# Patient Record
Sex: Male | Born: 1945 | Race: White | Hispanic: No | Marital: Single | Smoking: Never smoker
Health system: Southern US, Community
[De-identification: ages and names within clinical notes are randomized; demographics above are authoritative.]

## PROBLEM LIST (undated history)

## (undated) DIAGNOSIS — H269 Unspecified cataract: Secondary | ICD-10-CM

## (undated) DIAGNOSIS — M109 Gout, unspecified: Secondary | ICD-10-CM

## (undated) DIAGNOSIS — R011 Cardiac murmur, unspecified: Secondary | ICD-10-CM

## (undated) DIAGNOSIS — E079 Disorder of thyroid, unspecified: Secondary | ICD-10-CM

## (undated) DIAGNOSIS — M1990 Unspecified osteoarthritis, unspecified site: Secondary | ICD-10-CM

## (undated) DIAGNOSIS — E785 Hyperlipidemia, unspecified: Secondary | ICD-10-CM

## (undated) DIAGNOSIS — E039 Hypothyroidism, unspecified: Secondary | ICD-10-CM

## (undated) DIAGNOSIS — I10 Essential (primary) hypertension: Secondary | ICD-10-CM

## (undated) DIAGNOSIS — N189 Chronic kidney disease, unspecified: Secondary | ICD-10-CM

## (undated) HISTORY — DX: Disorder of thyroid, unspecified: E07.9

## (undated) HISTORY — DX: Chronic kidney disease, unspecified: N18.9

## (undated) HISTORY — PX: CERVICAL FUSION: SHX112

## (undated) HISTORY — DX: Unspecified cataract: H26.9

## (undated) HISTORY — DX: Essential (primary) hypertension: I10

## (undated) HISTORY — DX: Hyperlipidemia, unspecified: E78.5

## (undated) HISTORY — DX: Cardiac murmur, unspecified: R01.1

## (undated) NOTE — Progress Notes (Signed)
 Formatting of this note might be different from the original. Subjective  Patient ID: Juan Jennings is a 34 y.o. male presenting to the Urgent Care with a chief complaint of Sore Throat and Cough (X 2 days. ).  Sore/scratchy throat, cough x 2 days, states he has to have antibiotics or it gets real bad  History provided by:  Patient Language interpreter used: No   Sore Throat  This is a new problem. The current episode started yesterday. The problem has been rapidly worsening. There has been no fever. The pain is moderate. Associated symptoms include coughing. He has had no exposure to strep or mono.  Cough Associated symptoms include a sore throat.   Objective  BP 137/87 (BP Location: Right arm, Patient Position: Sitting, BP Cuff Size: Adult)   Pulse 101   Temp 36.8 C (98.2 F) (Oral)   Resp 17   Ht 1.753 m (5' 9)   Wt 83 kg (183 lb)   SpO2 96%   BMI 27.02 kg/m   Physical Exam Vitals reviewed.  Constitutional:      Appearance: Normal appearance.  HENT:     Nose: Congestion present.     Mouth/Throat:     Comments: Purulent post nasal drip Cardiovascular:     Rate and Rhythm: Normal rate and regular rhythm.     Heart sounds: Normal heart sounds.  Pulmonary:     Effort: Pulmonary effort is normal.     Breath sounds: Normal breath sounds.  Skin:    General: Skin is warm and dry.  Neurological:     General: No focal deficit present.     Mental Status: He is alert and oriented to person, place, and time.  Psychiatric:        Mood and Affect: Mood normal.        Behavior: Behavior normal.     Assessment & Plan   Assessment & Plan    In-House Lab Results:  No results found for this or any previous visit.   In-House Imaging Reads:    Procedure Documentation: Procedures   ED Course & MDM   Electronically signed by Dufm Stephane Lunger, CRNP at 05/29/2024  4:49 PM EDT

---

## 1984-01-26 HISTORY — PX: CYSTOSCOPY: SUR368

## 1996-02-26 HISTORY — PX: US RENAL/AORTA: HXRAD530

## 1998-05-03 HISTORY — PX: OTHER SURGICAL HISTORY: SHX169

## 2000-01-26 HISTORY — PX: OTHER SURGICAL HISTORY: SHX169

## 2000-02-11 ENCOUNTER — Inpatient Hospital Stay (HOSPITAL_COMMUNITY): Admission: EM | Admit: 2000-02-11 | Discharge: 2000-02-12 | Payer: Self-pay | Admitting: *Deleted

## 2000-02-11 ENCOUNTER — Encounter: Payer: Self-pay | Admitting: Emergency Medicine

## 2000-02-11 ENCOUNTER — Encounter: Payer: Self-pay | Admitting: Specialist

## 2000-02-11 HISTORY — PX: OTHER SURGICAL HISTORY: SHX169

## 2000-11-27 ENCOUNTER — Encounter: Payer: Self-pay | Admitting: Family Medicine

## 2000-11-27 LAB — CONVERTED CEMR LAB: PSA: 0.5 ng/mL

## 2003-06-28 ENCOUNTER — Encounter: Payer: Self-pay | Admitting: Family Medicine

## 2003-06-28 LAB — CONVERTED CEMR LAB: PSA: 0.4 ng/mL

## 2004-11-29 ENCOUNTER — Ambulatory Visit: Payer: Self-pay | Admitting: Internal Medicine

## 2005-08-27 HISTORY — PX: OTHER SURGICAL HISTORY: SHX169

## 2005-10-10 ENCOUNTER — Ambulatory Visit: Payer: Self-pay | Admitting: Family Medicine

## 2005-12-28 ENCOUNTER — Encounter: Payer: Self-pay | Admitting: Family Medicine

## 2005-12-28 LAB — CONVERTED CEMR LAB: PSA: 0.36 ng/mL

## 2006-01-04 ENCOUNTER — Ambulatory Visit: Payer: Self-pay | Admitting: Family Medicine

## 2006-01-22 ENCOUNTER — Ambulatory Visit: Payer: Self-pay | Admitting: Family Medicine

## 2006-02-21 ENCOUNTER — Ambulatory Visit: Payer: Self-pay | Admitting: Family Medicine

## 2006-06-07 ENCOUNTER — Ambulatory Visit: Payer: Self-pay | Admitting: Family Medicine

## 2006-07-05 ENCOUNTER — Ambulatory Visit: Payer: Self-pay | Admitting: Family Medicine

## 2006-07-09 ENCOUNTER — Ambulatory Visit: Payer: Self-pay | Admitting: Family Medicine

## 2006-09-14 ENCOUNTER — Ambulatory Visit: Payer: Self-pay | Admitting: Internal Medicine

## 2007-01-26 ENCOUNTER — Encounter: Payer: Self-pay | Admitting: Family Medicine

## 2007-01-26 LAB — CONVERTED CEMR LAB: PSA: 0.37 ng/mL

## 2007-02-06 ENCOUNTER — Ambulatory Visit: Payer: Self-pay | Admitting: Family Medicine

## 2007-02-06 LAB — CONVERTED CEMR LAB
BUN: 24 mg/dL — ABNORMAL HIGH (ref 6–23)
CO2: 30 meq/L (ref 19–32)
Calcium: 8.2 mg/dL — ABNORMAL LOW (ref 8.4–10.5)
Chloride: 102 meq/L (ref 96–112)
Creatinine, Ser: 1.7 mg/dL — ABNORMAL HIGH (ref 0.4–1.5)
GFR calc Af Amer: 54 mL/min
GFR calc non Af Amer: 44 mL/min
Glucose, Bld: 99 mg/dL (ref 70–99)
PSA: 0.37 ng/mL (ref 0.10–4.00)
Potassium: 3.6 meq/L (ref 3.5–5.1)
Sodium: 141 meq/L (ref 135–145)
TSH: 18.38 microintl units/mL — ABNORMAL HIGH (ref 0.35–5.50)

## 2007-04-02 ENCOUNTER — Telehealth (INDEPENDENT_AMBULATORY_CARE_PROVIDER_SITE_OTHER): Payer: Self-pay | Admitting: Internal Medicine

## 2007-07-25 ENCOUNTER — Ambulatory Visit: Payer: Self-pay | Admitting: Family Medicine

## 2007-07-25 ENCOUNTER — Telehealth (INDEPENDENT_AMBULATORY_CARE_PROVIDER_SITE_OTHER): Payer: Self-pay | Admitting: *Deleted

## 2007-07-25 DIAGNOSIS — M109 Gout, unspecified: Secondary | ICD-10-CM

## 2007-07-25 DIAGNOSIS — M2548 Effusion, other site: Secondary | ICD-10-CM | POA: Insufficient documentation

## 2007-08-28 ENCOUNTER — Ambulatory Visit: Payer: Self-pay | Admitting: Internal Medicine

## 2007-08-28 DIAGNOSIS — N183 Chronic kidney disease, stage 3 (moderate): Secondary | ICD-10-CM

## 2007-08-30 ENCOUNTER — Telehealth (INDEPENDENT_AMBULATORY_CARE_PROVIDER_SITE_OTHER): Payer: Self-pay | Admitting: Internal Medicine

## 2007-10-25 ENCOUNTER — Ambulatory Visit: Payer: Self-pay | Admitting: Family Medicine

## 2007-10-25 DIAGNOSIS — M62838 Other muscle spasm: Secondary | ICD-10-CM | POA: Insufficient documentation

## 2007-10-28 ENCOUNTER — Telehealth (INDEPENDENT_AMBULATORY_CARE_PROVIDER_SITE_OTHER): Payer: Self-pay | Admitting: Internal Medicine

## 2007-10-29 ENCOUNTER — Telehealth (INDEPENDENT_AMBULATORY_CARE_PROVIDER_SITE_OTHER): Payer: Self-pay | Admitting: *Deleted

## 2007-11-08 ENCOUNTER — Ambulatory Visit: Payer: Self-pay | Admitting: Family Medicine

## 2007-11-08 ENCOUNTER — Encounter (INDEPENDENT_AMBULATORY_CARE_PROVIDER_SITE_OTHER): Payer: Self-pay | Admitting: Internal Medicine

## 2007-11-13 LAB — CONVERTED CEMR LAB: TSH: 3.831 microintl units/mL (ref 0.350–5.50)

## 2007-11-16 ENCOUNTER — Ambulatory Visit: Payer: Self-pay | Admitting: Internal Medicine

## 2007-11-16 DIAGNOSIS — E039 Hypothyroidism, unspecified: Secondary | ICD-10-CM | POA: Insufficient documentation

## 2007-11-16 DIAGNOSIS — I10 Essential (primary) hypertension: Secondary | ICD-10-CM | POA: Insufficient documentation

## 2007-11-16 LAB — CONVERTED CEMR LAB: Rapid Strep: NEGATIVE

## 2007-11-25 ENCOUNTER — Encounter: Payer: Self-pay | Admitting: Family Medicine

## 2007-11-25 DIAGNOSIS — E785 Hyperlipidemia, unspecified: Secondary | ICD-10-CM | POA: Insufficient documentation

## 2007-11-25 DIAGNOSIS — Q898 Other specified congenital malformations: Secondary | ICD-10-CM

## 2007-11-25 DIAGNOSIS — I701 Atherosclerosis of renal artery: Secondary | ICD-10-CM

## 2007-11-25 DIAGNOSIS — Z87448 Personal history of other diseases of urinary system: Secondary | ICD-10-CM | POA: Insufficient documentation

## 2007-12-09 ENCOUNTER — Telehealth (INDEPENDENT_AMBULATORY_CARE_PROVIDER_SITE_OTHER): Payer: Self-pay | Admitting: Internal Medicine

## 2008-01-20 ENCOUNTER — Ambulatory Visit: Payer: Self-pay | Admitting: Family Medicine

## 2008-01-20 DIAGNOSIS — H6120 Impacted cerumen, unspecified ear: Secondary | ICD-10-CM

## 2008-01-20 DIAGNOSIS — J069 Acute upper respiratory infection, unspecified: Secondary | ICD-10-CM | POA: Insufficient documentation

## 2008-01-29 ENCOUNTER — Telehealth (INDEPENDENT_AMBULATORY_CARE_PROVIDER_SITE_OTHER): Payer: Self-pay | Admitting: Internal Medicine

## 2008-02-24 ENCOUNTER — Encounter (INDEPENDENT_AMBULATORY_CARE_PROVIDER_SITE_OTHER): Payer: Self-pay | Admitting: *Deleted

## 2008-03-10 ENCOUNTER — Ambulatory Visit: Payer: Self-pay | Admitting: Family Medicine

## 2008-03-13 LAB — CONVERTED CEMR LAB
AST: 38 units/L — ABNORMAL HIGH (ref 0–37)
Albumin: 4 g/dL (ref 3.5–5.2)
BUN: 19 mg/dL (ref 6–23)
Calcium: 9.4 mg/dL (ref 8.4–10.5)
Chloride: 102 meq/L (ref 96–112)
Cholesterol: 221 mg/dL (ref 0–200)
Creatinine, Ser: 1.5 mg/dL (ref 0.4–1.5)
Direct LDL: 154.6 mg/dL
GFR calc non Af Amer: 51 mL/min
HDL: 32 mg/dL — ABNORMAL LOW (ref >39.0)
PSA: 0.38 ng/mL (ref 0.10–4.00)
Triglycerides: 154 mg/dL — ABNORMAL HIGH (ref 0–149)
VLDL: 31 mg/dL (ref 0–40)

## 2008-07-07 ENCOUNTER — Telehealth (INDEPENDENT_AMBULATORY_CARE_PROVIDER_SITE_OTHER): Payer: Self-pay | Admitting: Internal Medicine

## 2008-07-21 ENCOUNTER — Encounter (INDEPENDENT_AMBULATORY_CARE_PROVIDER_SITE_OTHER): Payer: Self-pay | Admitting: Internal Medicine

## 2008-09-01 ENCOUNTER — Telehealth (INDEPENDENT_AMBULATORY_CARE_PROVIDER_SITE_OTHER): Payer: Self-pay | Admitting: Internal Medicine

## 2008-09-04 ENCOUNTER — Ambulatory Visit: Payer: Self-pay | Admitting: Family Medicine

## 2008-09-04 DIAGNOSIS — L0293 Carbuncle, unspecified: Secondary | ICD-10-CM

## 2008-09-04 DIAGNOSIS — L0292 Furuncle, unspecified: Secondary | ICD-10-CM | POA: Insufficient documentation

## 2008-09-04 DIAGNOSIS — M1710 Unilateral primary osteoarthritis, unspecified knee: Secondary | ICD-10-CM

## 2008-09-21 ENCOUNTER — Ambulatory Visit: Payer: Self-pay | Admitting: Family Medicine

## 2008-09-25 LAB — CONVERTED CEMR LAB
ALT: 74 units/L — ABNORMAL HIGH (ref 0–53)
AST: 50 units/L — ABNORMAL HIGH (ref 0–37)
Albumin: 3.8 g/dL (ref 3.5–5.2)
Alkaline Phosphatase: 89 units/L (ref 39–117)
Cholesterol: 218 mg/dL (ref 0–200)
Total Bilirubin: 0.9 mg/dL (ref 0.3–1.2)
Total CHOL/HDL Ratio: 7

## 2008-11-25 ENCOUNTER — Ambulatory Visit: Payer: Self-pay | Admitting: Family Medicine

## 2008-11-25 DIAGNOSIS — L989 Disorder of the skin and subcutaneous tissue, unspecified: Secondary | ICD-10-CM | POA: Insufficient documentation

## 2008-11-26 ENCOUNTER — Encounter (INDEPENDENT_AMBULATORY_CARE_PROVIDER_SITE_OTHER): Payer: Self-pay | Admitting: Internal Medicine

## 2008-12-07 ENCOUNTER — Telehealth: Payer: Self-pay | Admitting: Family Medicine

## 2008-12-29 ENCOUNTER — Encounter (INDEPENDENT_AMBULATORY_CARE_PROVIDER_SITE_OTHER): Payer: Self-pay | Admitting: Internal Medicine

## 2009-01-12 ENCOUNTER — Telehealth (INDEPENDENT_AMBULATORY_CARE_PROVIDER_SITE_OTHER): Payer: Self-pay | Admitting: Internal Medicine

## 2009-01-26 ENCOUNTER — Encounter (INDEPENDENT_AMBULATORY_CARE_PROVIDER_SITE_OTHER): Payer: Self-pay | Admitting: *Deleted

## 2009-04-09 ENCOUNTER — Ambulatory Visit: Payer: Self-pay | Admitting: Family Medicine

## 2009-04-16 LAB — CONVERTED CEMR LAB
AST: 75 units/L — ABNORMAL HIGH (ref 0–37)
CO2: 30 meq/L (ref 19–32)
Chloride: 108 meq/L (ref 96–112)
Cholesterol: 188 mg/dL (ref 0–200)
HDL: 32.4 mg/dL — ABNORMAL LOW (ref >39.00)
Sodium: 144 meq/L (ref 135–145)
TSH: 3.09 microintl units/mL (ref 0.35–5.50)
VLDL: 22.2 mg/dL (ref 0.0–40.0)

## 2009-05-07 ENCOUNTER — Ambulatory Visit: Payer: Self-pay | Admitting: Family Medicine

## 2009-05-07 DIAGNOSIS — M79609 Pain in unspecified limb: Secondary | ICD-10-CM | POA: Insufficient documentation

## 2009-06-30 ENCOUNTER — Ambulatory Visit: Payer: Self-pay | Admitting: Family Medicine

## 2009-07-05 ENCOUNTER — Ambulatory Visit: Payer: Self-pay | Admitting: Family Medicine

## 2009-07-08 ENCOUNTER — Encounter: Payer: Self-pay | Admitting: Family Medicine

## 2009-07-08 DIAGNOSIS — Z8639 Personal history of other endocrine, nutritional and metabolic disease: Secondary | ICD-10-CM

## 2009-07-08 DIAGNOSIS — Z862 Personal history of diseases of the blood and blood-forming organs and certain disorders involving the immune mechanism: Secondary | ICD-10-CM | POA: Insufficient documentation

## 2009-07-08 LAB — CONVERTED CEMR LAB
ALT: 105 units/L — ABNORMAL HIGH (ref 0–53)
AST: 89 units/L — ABNORMAL HIGH (ref 0–37)
Albumin: 3.8 g/dL (ref 3.5–5.2)
Total Protein: 6.9 g/dL (ref 6.0–8.3)

## 2009-07-22 ENCOUNTER — Encounter (INDEPENDENT_AMBULATORY_CARE_PROVIDER_SITE_OTHER): Payer: Self-pay | Admitting: Internal Medicine

## 2009-08-04 ENCOUNTER — Telehealth (INDEPENDENT_AMBULATORY_CARE_PROVIDER_SITE_OTHER): Payer: Self-pay | Admitting: Internal Medicine

## 2009-09-21 ENCOUNTER — Ambulatory Visit: Payer: Self-pay | Admitting: Internal Medicine

## 2009-09-21 DIAGNOSIS — R740 Nonspecific elevation of levels of transaminase and lactic acid dehydrogenase [LDH]: Secondary | ICD-10-CM

## 2009-09-21 DIAGNOSIS — R7401 Elevation of levels of liver transaminase levels: Secondary | ICD-10-CM | POA: Insufficient documentation

## 2009-09-21 LAB — CONVERTED CEMR LAB: Prothrombin Time: 10.9 s (ref 9.1–11.7)

## 2009-09-24 LAB — CONVERTED CEMR LAB
Anti Nuclear Antibody(ANA): NEGATIVE
Ceruloplasmin: 30 mg/dL (ref 21–63)

## 2009-10-01 ENCOUNTER — Ambulatory Visit (HOSPITAL_COMMUNITY): Admission: RE | Admit: 2009-10-01 | Discharge: 2009-10-01 | Payer: Self-pay | Admitting: Internal Medicine

## 2009-10-19 ENCOUNTER — Telehealth (INDEPENDENT_AMBULATORY_CARE_PROVIDER_SITE_OTHER): Payer: Self-pay | Admitting: Internal Medicine

## 2009-11-24 ENCOUNTER — Ambulatory Visit: Payer: Self-pay | Admitting: Family Medicine

## 2009-11-24 DIAGNOSIS — J020 Streptococcal pharyngitis: Secondary | ICD-10-CM

## 2010-01-19 ENCOUNTER — Ambulatory Visit: Payer: Self-pay | Admitting: Family Medicine

## 2010-01-19 DIAGNOSIS — M25569 Pain in unspecified knee: Secondary | ICD-10-CM

## 2010-01-19 LAB — CONVERTED CEMR LAB
Glucose, Synovial Fluid: 110 mg/dL
WBC, Synovial: 16055 — ABNORMAL HIGH (ref 0–200)

## 2010-02-01 ENCOUNTER — Encounter: Payer: Self-pay | Admitting: Family Medicine

## 2010-07-04 ENCOUNTER — Encounter (INDEPENDENT_AMBULATORY_CARE_PROVIDER_SITE_OTHER): Payer: Self-pay | Admitting: *Deleted

## 2010-07-08 ENCOUNTER — Telehealth: Payer: Self-pay | Admitting: Family Medicine

## 2010-07-26 ENCOUNTER — Encounter: Payer: Self-pay | Admitting: Family Medicine

## 2010-09-01 ENCOUNTER — Ambulatory Visit: Payer: Self-pay | Admitting: Family Medicine

## 2010-09-01 DIAGNOSIS — L259 Unspecified contact dermatitis, unspecified cause: Secondary | ICD-10-CM | POA: Insufficient documentation

## 2010-09-01 DIAGNOSIS — R21 Rash and other nonspecific skin eruption: Secondary | ICD-10-CM

## 2010-09-01 DIAGNOSIS — R252 Cramp and spasm: Secondary | ICD-10-CM | POA: Insufficient documentation

## 2010-10-06 ENCOUNTER — Ambulatory Visit: Payer: Self-pay | Admitting: Internal Medicine

## 2010-10-06 DIAGNOSIS — J029 Acute pharyngitis, unspecified: Secondary | ICD-10-CM | POA: Insufficient documentation

## 2010-10-16 ENCOUNTER — Encounter (INDEPENDENT_AMBULATORY_CARE_PROVIDER_SITE_OTHER): Payer: Self-pay | Admitting: *Deleted

## 2010-10-25 ENCOUNTER — Ambulatory Visit: Payer: Self-pay | Admitting: Internal Medicine

## 2010-10-25 ENCOUNTER — Encounter: Payer: Self-pay | Admitting: Family Medicine

## 2010-10-25 LAB — CONVERTED CEMR LAB
AST: 41 units/L
Albumin: 4.1 g/dL
Alkaline Phosphatase: 118 units/L
BUN: 24 mg/dL
Potassium: 4.5 meq/L

## 2010-10-27 ENCOUNTER — Encounter: Payer: Self-pay | Admitting: Family Medicine

## 2010-11-07 ENCOUNTER — Encounter (INDEPENDENT_AMBULATORY_CARE_PROVIDER_SITE_OTHER): Payer: Self-pay | Admitting: *Deleted

## 2010-11-11 ENCOUNTER — Encounter: Payer: Self-pay | Admitting: Family Medicine

## 2010-12-08 ENCOUNTER — Ambulatory Visit
Admission: RE | Admit: 2010-12-08 | Discharge: 2010-12-08 | Payer: Self-pay | Source: Home / Self Care | Attending: Family Medicine | Admitting: Family Medicine

## 2010-12-08 ENCOUNTER — Encounter: Payer: Self-pay | Admitting: Family Medicine

## 2010-12-08 LAB — CONVERTED CEMR LAB: TSH: 1.79 microintl units/mL

## 2010-12-09 ENCOUNTER — Encounter: Payer: Self-pay | Admitting: Family Medicine

## 2010-12-19 ENCOUNTER — Encounter: Payer: Self-pay | Admitting: Family Medicine

## 2010-12-19 ENCOUNTER — Telehealth: Payer: Self-pay | Admitting: Family Medicine

## 2010-12-27 NOTE — Assessment & Plan Note (Signed)
Summary: SWOLLEN KNEE   Vital Signs:  Patient profile:   65 year old male Height:      68 inches Weight:      197.50 pounds BMI:     30.14 Temp:     98.5 degrees F oral Pulse rate:   92 / minute Pulse rhythm:   regular BP sitting:   108 / 70  (left arm) Cuff size:   regular  Vitals Entered By: Linde Gillis CMA Duncan Dull) (January 19, 2010 3:42 PM) CC: swollen right knee   History of Present Illness: 65 year old male:  Right knee:  1 week history of knee pain.  Over the last week, has continued to have pain. By the time he got home, more swelling in the knee. Now it has gone down some. Very large effusion. No injury that he knows of. history significant for gout.  Foot was hurting some on Sunday. Raised his foot up and wrapped his leg up, got a little better. Swelling came back some.   no trauma. able to walk with a slight limp.   Allergies: 1)  ! Hydrochlorothiazide (Hydrochlorothiazide) 2)  ! Allopurinol  Past History:  Past medical, surgical, family and social histories (including risk factors) reviewed, and no changes noted (except as noted below).  Past Medical History: Reviewed history from 11/25/2007 and no changes required. Hypertension Gout Hypothyroidism Hyperlipidemia  Past Surgical History: Reviewed history from 11/25/2007 and no changes required. 4/97        Renal US, left cyst 3/85        Microhematuria IVP (-) cysto-prost. inflamm.                 Cysto (-) 05/03/98     Right renal artery [place] (-) , Abd [place] (-) AAA, left renal stenosis 02/11/00   Hosp. - syncope 3/01        Stress cardiolite - nml. 10/06      Left cataract with IOL  Family History: Reviewed history from 11/25/2007 and no changes required. Father: Died Mother: Died DM, renal disease, dialysis Siblings: Sister with renal disease, dialysis  Social History: Reviewed history from 01/20/2008 and no changes required. Never Smoked Alcohol use-no divorced moving to [place] in near  future--2/09  Review of Systems       REVIEW OF SYSTEMS  GEN: No systemic complaints, no fevers, chills, sweats, or other acute illnesses MSK: Detailed in the HPI GI: tolerating PO intake without difficulty Neuro: No numbness, parasthesias, or tingling associated. Otherwise the pertinent positives of the ROS are noted above.    Physical Exam  General:  GEN: Well-developed,well-nourished,in no acute distress; alert,appropriate and cooperative throughout examination HEENT: Normocephalic and atraumatic without obvious abnormalities. No apparent alopecia or balding. Ears, externally no deformities PULM: Breathing comfortably in no respiratory distress EXT: No clubbing, cyanosis, or edema PSYCH: Normally interactive. Cooperative during the interview. Pleasant. Friendly and conversant. Not anxious or depressed appearing. Normal, full affect.  Msk:  R knee: full extension. Flexion to approximately 95. Massive effusion.  nontender along all bony anatomy. Stable varus and valgus stress and negative Lachman. Effusion limits all other further special testing of the knee.    Impression & Recommendations:  Problem # 1:  KNEE PAIN, RIGHT (ICD-719.46) Assessment New knee pain, right, with large effusion. differential includes gout, acute cartilage injury, do not suspect clinically an intra-articular fracture. Ligamentous structures appear stable on exam, cannot fully rule out internal derangement. much less likely infectious arthritis.  plan to  aspirate knee, and central synovial panel. Patient is currently on colchicine and Voltaren p.o. b.i.d.  Knee Aspiration and Injection Patient verbally consented; risks, benefits, and alternatives explained. Patient prepped with betadine. Ethyl chloride for anesthesia. 10 cc of 1% Lidocaine used in wheal then injected Subcutaneous fashion with 27 gauge needle on lateral approach. Under sterilne conditions, 18 gauge needle used via lateral approach to  aspirate 60 cc of yellowish, somewhat cloudy fluid. Then 9 cc of Marcaine 0.5% and 1 cc of Kenalog 40 mg injected. Tolerated well, decreased pain, no complications.   Given appearance of synovial fluid, gout?  His updated medication list for this problem includes:    Diclofenac Sodium 50 Mg Tbec (Diclofenac sodium) .Marland Kitchen... 1 by mouth two times a day as needed pain    Tylenol 325 Mg Tabs (Acetaminophen) ..... Otc as directed.  Orders: Specimen Handling (66440) T- * Misc. Laboratory test [id number]) Joint Aspirate / Injection, Large (20610) Kenalog 10mg  (4units) (J3301)  Problem # 2:  GOUT (ICD-274.9)  His updated medication list for this problem includes:    Colchicine 0.6 Mg Tabs (Colchicine) .Marland Kitchen... Take one by mouth two times a day    Diclofenac Sodium 50 Mg Tbec (Diclofenac sodium) .Marland Kitchen... 1 by mouth two times a day as needed pain    Uloric 40 Mg Tabs (Febuxostat) ..... Once daily  Problem # 3:  EFFUSION, JOINT, OTHER SPEC SITE (ICD-719.08)  Complete Medication List: 1)  Colchicine 0.6 Mg Tabs (Colchicine) .... Take one by mouth two times a day 2)  Diclofenac Sodium 50 Mg Tbec (Diclofenac sodium) .Marland Kitchen.. 1 by mouth two times a day as needed pain 3)  Levoxyl 150 Mcg Tabs (Levothyroxine sodium) .Marland Kitchen.. 1 once daily for thyroid 4)  Tylenol 325 Mg Tabs (Acetaminophen) .... Otc as directed. 5)  Uloric 40 Mg Tabs (Febuxostat) .... Once daily 6)  Micardis 80 Mg Tabs (Telmisartan) .... Take one by mouth daily 7)  Zithromax Z-pak 250 Mg Tabs (Azithromycin) .... Use as directed  Current Allergies (reviewed today): ! HYDROCHLOROTHIAZIDE (HYDROCHLOROTHIAZIDE) ! ALLOPURINOL

## 2010-12-27 NOTE — Letter (Signed)
Summary: [facility]  [facility]   Imported By: Lanelle Bal 08/15/2010 12:59:00  _____________________________________________________________________  External Attachment:    Type:   Image     Comment:   External Document

## 2010-12-27 NOTE — Progress Notes (Signed)
Summary: refill request for diclofenac  Phone Note Refill Request Message from:  Fax from Pharmacy  Refills Requested: Medication #1:  DICLOFENAC SODIUM 50 MG TBEC 1 by mouth two times a day as needed pain   Last Refilled: 06/09/2010 [facility] [address], [phone].  Initial call taken by: Lowella Petties CMA,  July 08, 2010 10:34 AM  Follow-up for Phone Call       Follow-up by: Crawford Givens MD,  July 08, 2010 2:43 PM    Prescriptions: DICLOFENAC SODIUM 50 MG TBEC (DICLOFENAC SODIUM) 1 by mouth two times a day as needed pain  #60 x 6   Entered and Authorized by:   Crawford Givens MD   Signed by:   Crawford Givens MD on 07/08/2010   Method used:   [facility]  [address]       [address]       [place], [place]  [address]       Ph: [phone] or [phone]       Fax: [phone]   RxID:   [id number]

## 2010-12-27 NOTE — Letter (Signed)
Summary: [facility]   Imported By: Maryln Gottron 02/14/2010 13:48:37  _____________________________________________________________________  External Attachment:    Type:   Image     Comment:   External Document

## 2010-12-27 NOTE — Miscellaneous (Signed)
Summary: phone call  Clinical Lists Changes received letter from [facility] - rec checking TSH as pt on levothyroxine and hasn't had checked in 12 mo.  please call patient and ask to come in for blood work - due for recheck of thyroid.  TSH, CMP, 272.4, 401.9, 588.9, 244.9.  If pt wants may schedule CPE with PCP and come in for fasting blood work.  If fasting, and wants CPE, add FLP, PSA. Eustaquio Boyden  MD  October 16, 2010 3:23 PM   Left message on home machine for patient to return my call. Kim Dance CMA Duncan Dull)  October 17, 2010 11:52 AM   Spoke with patient and he only blood work at this time. CMP and TSH scheduled for tomorrow. Kim Dance CMA Duncan Dull)  October 24, 2010 9:53 AM

## 2010-12-27 NOTE — Medication Information (Signed)
Summary: Care Consideration for Levothyroxine/[facility]  Care Consideration for Levothyroxine/[facility]   Imported By: Sherian Rein 11/01/2010 08:15:22  _____________________________________________________________________  External Attachment:    Type:   Image     Comment:   External Document

## 2010-12-27 NOTE — Letter (Signed)
Summary: Nadara Eaton letter  [place] at [facility]  [address] [place], [place] [address]   Phone: [phone]  Fax: [phone]       07/04/2010 MRN: [id number]  [facility] [address] [place], [place]  [address]  Dear Mr. [facility] [place], and Philip announce the retirement of Arta Silence, M.D., from full-time practice at the [facility] office effective May 26, 2010 and his plans of returning part-time.  It is important to Dr. Hetty Ely and to our practice that you understand that [facility] has seven physicians in our office for your health care needs.  We will continue to offer the same exceptional care that you have today.    Dr. Hetty Ely has spoken to many of you about his plans for retirement and returning part-time in the fall.   We will continue to work with you through the transition to schedule appointments for you in the office and meet the high standards that [facility] is committed to.   Again, it is with great pleasure that we share the news that Dr. Hetty Ely will return to [facility] in October of 2011 with a reduced schedule.    If you have any questions, or would like to request an appointment with one of our physicians, please call us at [phone] and press the option for Scheduling an appointment.  We take pleasure in providing you with excellent patient care and look forward to seeing you at your next office visit.  Our [facility] Physicians are:  Tillman Abide, M.D. Laurita Quint, M.D. Roxy Manns, M.D. Kerby Nora, M.D. Hannah Beat, M.D. Ruthe Mannan, M.D. We proudly welcomed Raechel Ache, M.D. and Eustaquio Boyden, M.D. to the practice in July/August 2011.  Sincerely,  [place]

## 2010-12-27 NOTE — Assessment & Plan Note (Signed)
Summary: RASH ON FEET/DLO   Vital Signs:  Patient profile:   65 year old male Height:      68 inches Weight:      188.0 pounds BMI:     28.69 Temp:     97.5 degrees F oral Pulse rate:   84 / minute Pulse rhythm:   regular BP sitting:   100 / 70  (left arm) Cuff size:   regular  Vitals Entered By: Benny Lennert CMA Duncan Dull) (September 01, 2010 8:10 AM)  History of Present Illness: Chief complaint Rash on feet  65 year old male:  soaked in epsom ssalts  over summer, when feet profusely sweating in [place] boots, broke out. some itching.  not effected when wearing ecco boots or other shoes. has not had in the past.  cramps: also c/o intermittent cramping, particularly when being in [place], thinks may not be drinking as much fluid while up there.   Allergies: 1)  ! Hydrochlorothiazide (Hydrochlorothiazide) 2)  ! Allopurinol  Past History:  Past medical, surgical, family and social histories (including risk factors) reviewed, and no changes noted (except as noted below).  Past Medical History: Hypertension Gout Hypothyroidism Hyperlipidemia CKD  Past Surgical History: Reviewed history from 11/25/2007 and no changes required. 4/97        Renal US, left cyst 3/85        Microhematuria IVP (-) cysto-prost. inflamm.                 Cysto (-) 05/03/98     Right renal artery [place] (-) , Abd [place] (-) AAA, left renal stenosis 02/11/00   Hosp. - syncope 3/01        Stress cardiolite - nml. 10/06      Left cataract with IOL  Family History: Reviewed history from 11/25/2007 and no changes required. Father: Died Mother: Died DM, renal disease, dialysis Siblings: Sister with renal disease, dialysis  Social History: Reviewed history from 01/20/2008 and no changes required. Never Smoked Alcohol use-no divorced moving to [place] in near future--2/09  Review of Systems       REVIEW OF SYSTEMS GEN: Acute illness details above. CV: No chest pain or SOB GI: No noted N or  V Otherwise, pertinent positives and negatives are noted in the HPI.   Physical Exam  General:  GEN: Well-developed,well-nourished,in no acute distress; alert,appropriate and cooperative throughout examination HEENT: Normocephalic and atraumatic without obvious abnormalities. No apparent alopecia or balding. Ears, externally no deformities PULM: Breathing comfortably in no respiratory distress EXT: No clubbing, cyanosis, or edema PSYCH: Normally interactive. Cooperative during the interview. Pleasant. Friendly and conversant. Not anxious or depressed appearing. Normal, full affect.  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 Apply two times a day to affected areas on feet  Problem # 2:  CRAMP OF LIMB (ICD-729.82) Assessment: New discussed hydration strategies  Complete Medication List: 1)  Colchicine 0.6 Mg Tabs (Colchicine) .... Take one by mouth two times a day 2)  Diclofenac Sodium 50 Mg Tbec (Diclofenac sodium) .Marland Kitchen.. 1 by mouth two times a day as needed pain 3)  Levoxyl 150 Mcg Tabs (Levothyroxine sodium) .Marland KitchenMarland KitchenMarland Kitchen  1 once daily for thyroid 4)  Tylenol 325 Mg Tabs (Acetaminophen) .... Otc as directed. 5)  Uloric 40 Mg Tabs (Febuxostat) .... Once daily 6)  Micardis 80 Mg Tabs (Telmisartan) .... Take one by mouth daily 7)  Fluocinonide 0.05 % Oint (Fluocinonide) .... Apply two times a day to affected areas on feet Prescriptions: FLUOCINONIDE 0.05 % OINT (FLUOCINONIDE) Apply two times a day to affected areas on feet  #60 grams x 1   Entered and Authorized by:   Hannah Beat MD   Signed by:   Hannah Beat MD on 09/01/2010   Method  used:   [facility]        [facility]  [address]ail)       [address]       [place], [place]  [address]       Ph: [phone] or [phone]       Fax: [phone]   RxID:   [id number]   Current Allergies (reviewed today): ! HYDROCHLOROTHIAZIDE (HYDROCHLOROTHIAZIDE) ! ALLOPURINOL

## 2010-12-27 NOTE — Assessment & Plan Note (Signed)
Summary: ST/CLE   Vital Signs:  Patient profile:   65 year old male Weight:      195.75 pounds Temp:     97.9 degrees F oral Pulse rate:   64 / minute Pulse rhythm:   regular BP sitting:   128 / 80  (left arm) Cuff size:   large  Vitals Entered By: Selena Batten Dance CMA Duncan Dull) (October 06, 2010 3:54 PM) CC: Sore throat   History of Present Illness: CC: ST  4d h/o ST.  + PNDrip.  feels swollen tonsils.  Hasn't tried anything for this so far.  + sinus drainage.  tends to get this in fall and spring.  usually needs Rx to prevent worsening.  No abd pain, n/v, cough, myalgias, arthralgias, rash.  No fevers/chills.    + h/o gout.  recent flare after liver pudding.  takes uloric and diclofenac.  Partner sick at home.  Partner smokes.    Also requests 90 day supply of colchicine, diclofenac, and synthroid to send for mail meds.  Current Medications (verified): 1)  Colchicine 0.6 Mg  Tabs (Colchicine) .... Take One By Mouth Two Times A Day 2)  Diclofenac Sodium 50 Mg Tbec (Diclofenac Sodium) .Marland Kitchen.. 1 By Mouth Two Times A Day As Needed Pain 3)  Levoxyl 150 Mcg Tabs (Levothyroxine Sodium) .Marland Kitchen.. 1 Once Daily For Thyroid 4)  Tylenol 325 Mg Tabs (Acetaminophen) .... Otc As Directed. 5)  Uloric 40 Mg Tabs (Febuxostat) .... Once Daily 6)  Micardis 80 Mg Tabs (Telmisartan) .... Take One By Mouth Daily 7)  Fluocinonide 0.05 % Oint (Fluocinonide) .... Apply Two Times A Day To Affected Areas On Feet  Allergies: 1)  ! Hydrochlorothiazide (Hydrochlorothiazide) 2)  ! Allopurinol  Past History:  Past Medical History: Last updated: 09/01/2010 Hypertension Gout Hypothyroidism Hyperlipidemia CKD  Social History: Last updated: 01/20/2008 Never Smoked Alcohol use-no divorced moving to [place] in near future--2/09  Review of Systems       per HPI  Physical Exam  General:  WDWN, NAD Head:  Normocephalic and atraumatic without obvious abnormalities. No apparent alopecia or balding. Eyes:   No corneal or conjunctival inflammation noted. EOMI. Perrla.  Ears:  TMs clear bilaterally, minimal cerumen Nose:  nares clear Mouth:  MMM, + pharyngeal erythema and PND, no exudates.  + enlarged R tonsil 2+ to 3+, no exudate. Neck:  mild R AC LAD, nontender Lungs:  normal respiratory effort and normal breath sounds.   Heart:  Normal rate and regular rhythm. S1 and S2 normal without gallop, murmur, click, rub or other extra sounds. Pulses:  2+ rad pulses Extremities:  no pedal edema Skin:  Intact without suspicious lesions or rashes   Impression & Recommendations:  Problem # 1:  ACUTE PHARYNGITIS (ICD-462) 2/4 ranson criteria met.  rapid strep = negative.  likely viral.  R tonsil significantly red, swollen, + R AC LAD.  could be beginnings of tonsillitis.  PCN script to fill in case not improving.  encouraged to continue supportive care.  given PND and seasonality, could be allergic drainage irritating throat.  trial of zyrtec.  His updated medication list for this problem includes:    Diclofenac Sodium 50 Mg Tbec (Diclofenac sodium) .Marland Kitchen... 1 by mouth two times a day as needed pain    Tylenol 325 Mg Tabs (Acetaminophen) ..... Otc as directed.    Penicillin V Potassium 500 Mg Tabs (Penicillin v potassium) .Marland Kitchen... Take two pills two times a day x 10 days  Orders: Rapid Strep (  [address])  Complete Medication List: 1)  Colchicine 0.6 Mg Tabs (Colchicine) .... Take one by mouth two times a day 2)  Diclofenac Sodium 50 Mg Tbec (Diclofenac sodium) .Marland Kitchen.. 1 by mouth two times a day as needed pain 3)  Levoxyl 150 Mcg Tabs (Levothyroxine sodium) .Marland Kitchen.. 1 once daily for thyroid 4)  Tylenol 325 Mg Tabs (Acetaminophen) .... Otc as directed. 5)  Uloric 40 Mg Tabs (Febuxostat) .... Once daily 6)  Micardis 80 Mg Tabs (Telmisartan) .... Take one by mouth daily 7)  Fluocinonide 0.05 % Oint (Fluocinonide) .... Apply two times a day to affected areas on feet 8)  Penicillin V Potassium 500 Mg Tabs (Penicillin v  potassium) .... Take two pills two times a day x 10 days 9)  Zyrtec Allergy 10 Mg Caps (Cetirizine hcl) .... Take one nightly for drainage  Patient Instructions: 1)  Push fluids, continue diclofenac.  Suck on cold things like popsicles, or heat to soothe throat like herbal teas, consider salt water gargles. 2)  Penicillin script to take two pills twice daily in case throat not improving. 3)  Start zyrtec daily (if makes you sleepy take nightly) to see if we can help with the drainage and sore throat. 4)  If fever >101.5, trouble swallowing or breathing or opening mouth, drooling, or other concerns, you may need to return to be seen. 5)  90 day prescriptions for thyroid med, diclofenac, and colchicine provided. 6)  Pleasure to see you today, call clinic with questions. Prescriptions: ZYRTEC ALLERGY 10 MG CAPS (CETIRIZINE HCL) take one nightly for drainage  #30 x 3   Entered and Authorized by:   Eustaquio Boyden  MD   Signed by:   Eustaquio Boyden  MD on 10/06/2010   Method used:   Historical   RxID:   [id number] PENICILLIN V POTASSIUM 500 MG TABS (PENICILLIN V POTASSIUM) take two pills two times a day x 10 days  #40 x 0   Entered and Authorized by:   Eustaquio Boyden  MD   Signed by:   Eustaquio Boyden  MD on 10/06/2010   Method used:   Print then Give to Patient   RxID:   [id number] LEVOXYL 150 MCG TABS (LEVOTHYROXINE SODIUM) 1 once daily for thyroid  #90 x 3   Entered and Authorized by:   Eustaquio Boyden  MD   Signed by:   Eustaquio Boyden  MD on 10/06/2010   Method used:   Print then Give to Patient   RxID:   [id number] DICLOFENAC SODIUM 50 MG TBEC (DICLOFENAC SODIUM) 1 by mouth two times a day as needed pain  #180 x 3   Entered and Authorized by:   Eustaquio Boyden  MD   Signed by:   Eustaquio Boyden  MD on 10/06/2010   Method used:   Print then Give to Patient   RxID:   [id number] COLCHICINE 0.6 MG  TABS (COLCHICINE) Take one by mouth two times a day  #180  x 3   Entered and Authorized by:   Eustaquio Boyden  MD   Signed by:   Eustaquio Boyden  MD on 10/06/2010   Method used:   Print then Give to Patient   RxID:   [id number]    Orders Added: 1)  Est. Patient Level III [60109] 2)  Rapid Strep [32[address]]    Current Allergies (reviewed today): ! HYDROCHLOROTHIAZIDE (HYDROCHLOROTHIAZIDE) ! ALLOPURINOL

## 2010-12-29 NOTE — Letter (Signed)
Summary: Generic Letter  [place] at [facility]  [address] [place], [place] [address]   Phone: [phone]  Fax: [phone]    11/07/2010  [facility] [address] [place], [place]  [address]  Dear Mr. Lecrone,  I've been trying to reach you to let you know that you will need follow up labs around the first or second week in January. This is to ensure that the increase in your thyroid medication is working appropriately. Please call the office to schedule these labs as soon as possible so that you will not run out of your medication.           Sincerely,      Selena Batten Dance CMA (AAMA)

## 2010-12-29 NOTE — Medication Information (Signed)
Summary: Letter Regarding Levothyroxine & TSH Monitoring/[facility]  Letter Regarding Levothyroxine & TSH Monitoring/[facility]   Imported By: Lanelle Bal 11/29/2010 12:54:39  _____________________________________________________________________  External Attachment:    Type:   Image     Comment:   External Document

## 2010-12-29 NOTE — Progress Notes (Signed)
Summary: needs written rx  Phone Note Refill Request Message from:  Patient on December 19, 2010 11:38 AM  Refills Requested: Medication #1:  LEVOXYL 150 MCG TABS 1 once daily for thyroid  Medication #2:  LEVOXYL 25 MCG TABS 1 by mouth once daily in addition to the pill. Patient is asking for a written script for these for mail order. He would like them combined in to one instead of having to take 2 pills every day. Please call patient when rx is ready.    Method Requested: Pick up at Office Initial call taken by: Melody Comas,  December 19, 2010 11:40 AM  Follow-up for Phone Call        done.  please notify ready to pick up. Follow-up by: Eustaquio Boyden  MD,  December 19, 2010 12:32 PM  Additional Follow-up for Phone Call Additional follow up Details #1::        Notified patient's wife that Rx was ready for pick up. Rx placed up front for patient.  Additional Follow-up by: Janee Morn CMA Duncan Dull),  December 19, 2010 1:37 PM    New/Updated Medications: LEVOXYL 175 MCG TABS (LEVOTHYROXINE SODIUM) take one daily Prescriptions: LEVOXYL 175 MCG TABS (LEVOTHYROXINE SODIUM) take one daily  #90 x 3   Entered and Authorized by:   Eustaquio Boyden  MD   Signed by:   Eustaquio Boyden  MD on 12/19/2010   Method used:   Print then Give to Patient   RxID:   [id number]

## 2010-12-29 NOTE — Miscellaneous (Signed)
Summary: Nov. 2011 Labs  Clinical Lists Changes  Observations: Added new observation of TSH: 6.41 microintl units/mL (10/25/2010 13:59) Added new observation of CALCIUM: 9.5 mg/dL (16/08/9603 54:09) Added new observation of ALBUMIN: 4.1 g/dL (81/19/1478 29:56) Added new observation of PROTEIN, TOT: 6.6 g/dL (21/30/8657 84:69) Added new observation of SGPT (ALT): 46 units/L (10/25/2010 13:59) Added new observation of SGOT (AST): 41 units/L (10/25/2010 13:59) Added new observation of ALK PHOS: 118 units/L (10/25/2010 13:59) Added new observation of BILI TOTAL: 0.6 mg/dL (62/95/2841 32:44) Added new observation of CREATININE: 1.20 mg/dL (11/29/7251 66:44) Added new observation of BUN: 24 mg/dL (03/47/4259 56:38) Added new observation of BG RANDOM: 80 mg/dL (75/64/3329 51:88) Added new observation of CO2 PLSM/SER: 21 meq/L (10/25/2010 13:59) Added new observation of CL SERUM: 107 meq/L (10/25/2010 13:59) Added new observation of K SERUM: 4.5 meq/L (10/25/2010 13:59) Added new observation of NA: 142 meq/L (10/25/2010 13:59)

## 2010-12-29 NOTE — Miscellaneous (Signed)
Summary: TSH update  Clinical Lists Changes  Observations: Added new observation of TSH: 1.79 microintl units/mL (12/08/2010 15:28)

## 2011-01-02 ENCOUNTER — Encounter: Payer: Self-pay | Admitting: Family Medicine

## 2011-01-12 NOTE — Letter (Signed)
Summary: request for billing information  request for billing information   Imported By: Kassie Mends 01/06/2011 09:22:11  _____________________________________________________________________  External Attachment:    Type:   Image     Comment:   External Document

## 2011-01-20 ENCOUNTER — Encounter: Payer: Self-pay | Admitting: Family Medicine

## 2011-01-20 LAB — CONVERTED CEMR LAB
BUN: 19 mg/dL
Chloride: 105 meq/L
Phosphorus: 2.8 mg/dL
Potassium: 4.4 meq/L
Protein, ur: 200 mg/dL
Uric Acid, Serum: 4.9 mg/dL

## 2011-02-01 ENCOUNTER — Encounter: Payer: Self-pay | Admitting: Family Medicine

## 2011-02-07 NOTE — Letter (Signed)
Summary: [facility]  [facility]   Imported By: Maryln Gottron 02/01/2011 13:27:03  _____________________________________________________________________  External Attachment:    Type:   Image     Comment:   External Document  Appended Document: [facility]    Clinical Lists Changes  Medications: Added new medication of TWYNSTA 80-5 MG TABS (TELMISARTAN-AMLODIPINE) one daily for blood pressure Changed medication from COLCHICINE 0.6 MG  TABS (COLCHICINE) Take one by mouth two times a day to COLCRYS 0.6 MG TABS (COLCHICINE) one as needed for gout flare, may repeat in 1 hour x 1

## 2011-04-14 NOTE — Discharge Summary (Signed)
[place]  Patient:    Juan Jennings, Juan Jennings                       MRN: [id number] Adm. Date:  [id number] Disc. Date: 02/12/00 Attending:  Daisey Must Dictator:   Lavella Hammock, P.A.-C. CC:         Billie-Lynn Bean, P.A.-C.                           Discharge Summary  DATE OF BIRTH:  07-20-1946  PROCEDURE:  None.  HISTORY OF PRESENT ILLNESS:  Mr. Pingree is a 65 year old male with no prior cardiac history, and no history of syncopal episodes, who was watching his daughter having blood drawn, when he suddenly developed generalized flushing and presyncope. He fell and was unresponsive for a few seconds, but then returned to consciousness  without disorientation or any bowel or bladder incontinence.  He had no palpitations, and denied any subsequent symptoms.  He was admitted to rule out  myocardial infarction and for further evaluation, because he had an electrocardiogram with some J-point elevation that was abnormal.  The patient had no chest pain and no further episodes of syncope overnight, and did well.  The ext day his vitals were within normal limits.  He was ambulatory without difficulty, and the only abnormality on his laboratory values was a white count of 12.8. He had enzymes negative for a myocardial infarction and was considered stable for discharge on February 12, 2000.  LABORATORY DATA:  Hemoglobin 14.8, hematocrit 41.3, wbcs 12.8, platelets 213. Sodium 139, potassium 3.6, chloride 101, CO2 of 31, BUN 19, creatinine 1.1, glucose 94.  Total bilirubin 0.6, alkaline phosphatase 76, SGOT 28, SGPT 24, total protein 6.3, albumin 3.7, calcium 9.0.  Serial CPK-MB and troponin I negative for myocardial infarction x 2.  Electrocardiogram:  Sinus rhythm with J-point elevation in V1 and V2 with no acute changes.  CONDITION ON DISCHARGE:  Stable.  CONSULTATION:  None.  COMPLICATIONS:  None.  DISCHARGE DIAGNOSES: 1. Syncope,  probably vasovagal.  PLAN:  Follow up as an outpatient with a stress test at that time and a CBC.  2. Leukocytosis. Chest x-ray was without acute disease but showed a questionable    right upper lobe nodule and he is to get a follow-up chest x-ray at his    family physicians. 3. Hypertension. 4. Hyperlipidemia. 5. Remote history of tobacco use. 6. Abnormal chest x-ray.  PLAN:  Follow up as an outpatient.  DISCHARGE INSTRUCTIONS:  His activity level is as tolerated.  DIET:  He is to sick to a low-fat diet.  FOLLOWUP:  He is to call our office to schedule a stress test.  He is to follow up with his primary medical doctor for a CBC and a repeat chest x-ray.  DISCHARGE MEDICATIONS: 1. Lotrel 5/10 q.d. 2. Lasix as taken prior to admission. 3. Levoxyl as taken prior to admission.  ADDENDUM:  TSH and fasting lipid profile is pending at the time of discharge.DD:  02/12/00 TD:  02/12/00 Job: 2028 [id number]

## 2011-04-14 NOTE — Discharge Summary (Signed)
[place]. [facility]  Patient:    Juan Jennings, Juan Jennings                       MRN: [id number] Adm. Date:  [id number] Attending:  Daisey Must Dictator:   Lavella Hammock, P.A.-C. CC:         Billie-Lynn Bean, P.A.-C.                           Discharge Summary  DATE OF BIRTH:  Jul 01, 1946  ADDENDUM:  [name] had his follow-up PA and lateral chest x-ray, and it showed no active disease and a calcified granuloma in the right upper lobe.  Follow up p.r.n. DD:  02/12/00 TD:  02/12/00 Job: 2029 [id number]

## 2011-04-20 ENCOUNTER — Encounter: Payer: Self-pay | Admitting: Family Medicine

## 2011-04-21 ENCOUNTER — Encounter: Payer: Self-pay | Admitting: Family Medicine

## 2011-04-21 ENCOUNTER — Ambulatory Visit (INDEPENDENT_AMBULATORY_CARE_PROVIDER_SITE_OTHER): Payer: BC Managed Care – PPO | Admitting: Family Medicine

## 2011-04-21 VITALS — BP 110/70 | HR 63 | Temp 98.2°F | Ht 70.0 in | Wt 194.1 lb

## 2011-04-21 DIAGNOSIS — R011 Cardiac murmur, unspecified: Secondary | ICD-10-CM

## 2011-04-21 DIAGNOSIS — J069 Acute upper respiratory infection, unspecified: Secondary | ICD-10-CM

## 2011-04-21 LAB — POCT RAPID STREP A (OFFICE): Rapid Strep A Screen: NEGATIVE

## 2011-04-21 NOTE — Progress Notes (Signed)
"  I felt it coming on."  He started feeling differently when the weather changed.  Yesterday his R eye was red and matted, irritated. Chills last night, alternating with a hot feeling.  Less matting in eye this AM and dec in redness.  The eye doesn't feel gritty now.  Vision at baseline.  Fatigued.   duration of symptoms: a few days rhinorrhea:yes congestion:yes ear pain: no sore throat: occ, 'scratchy' cough:some Myalgias: mild, diffuse  ROS: See HPI.  Otherwise negative.    Meds, vitals, and allergies reviewed.   GEN: nad, alert and oriented HEENT: mucous membranes moist, TM w/o erythema, nasal epithelium injected, OP with cobblestoning, R eye with minimal irritation but PERRL, EOMI and L eye wnl.  Fundus wnl x2, no FB.  NECK: supple w/o LA CV: rrr. New SEM noted PULM: ctab, no inc wob EXT: no edema

## 2011-04-21 NOTE — Assessment & Plan Note (Signed)
Likely viral and should self resolve.  D/w pt and he understood.

## 2011-04-21 NOTE — Patient Instructions (Addendum)
The cold symptoms should get better gradually.  See Juan Jennings about your referral before your leave today.  We'll let you know about the echo (heart test) results.  I would schedule a physical at some point this fall.

## 2011-04-21 NOTE — Assessment & Plan Note (Signed)
New murmur.  No CP and not SOB.  I would proceed with outpatient echo to eval further.  D/w pt and he understood.  >25 min spent with face to face with patient, >50% counseling and coordinating care

## 2011-05-01 ENCOUNTER — Telehealth: Payer: Self-pay | Admitting: *Deleted

## 2011-05-01 ENCOUNTER — Other Ambulatory Visit: Payer: Self-pay | Admitting: Cardiology

## 2011-05-01 DIAGNOSIS — R011 Cardiac murmur, unspecified: Secondary | ICD-10-CM

## 2011-05-01 MED ORDER — AMOXICILLIN 875 MG PO TABS: 875.0000 mg | ORAL_TABLET | Freq: Two times a day (BID) | ORAL | Status: AC

## 2011-05-01 NOTE — Telephone Encounter (Signed)
Left message for patient to call back  

## 2011-05-01 NOTE — Telephone Encounter (Signed)
I sent the amoxil.  I would try that.  If not improved, after the abx, then let us know.  Thanks.

## 2011-05-01 NOTE — Telephone Encounter (Signed)
Pt was seen over a week ago for a sore throat, he is some better but still has a sore throat.  He is asking that [facility].  He says he knows that if he doesn't get an antibiotic he will not get better.

## 2011-05-01 NOTE — Telephone Encounter (Signed)
Please call pt and see what else is going on and let me know.  Thanks.

## 2011-05-01 NOTE — Telephone Encounter (Signed)
Spoke to patient and was advised that his throat is still sore, no fever, no cough, but he spits up green stuff first thing in the mornings and some times during the day.  Patient states that every time he has the flare up with his tonsils it will not clear up until he takes an antibiotic. [facility] [place]

## 2011-05-02 ENCOUNTER — Other Ambulatory Visit (INDEPENDENT_AMBULATORY_CARE_PROVIDER_SITE_OTHER): Payer: BC Managed Care – PPO | Admitting: *Deleted

## 2011-05-02 DIAGNOSIS — R011 Cardiac murmur, unspecified: Secondary | ICD-10-CM

## 2011-05-02 NOTE — Telephone Encounter (Signed)
Left message for patient to call back  

## 2011-05-02 NOTE — Telephone Encounter (Signed)
Patient notified as instructed by telephone. 

## 2011-05-04 ENCOUNTER — Encounter: Payer: Self-pay | Admitting: Family Medicine

## 2011-05-16 ENCOUNTER — Encounter: Payer: Self-pay | Admitting: Family Medicine

## 2011-12-07 ENCOUNTER — Ambulatory Visit (INDEPENDENT_AMBULATORY_CARE_PROVIDER_SITE_OTHER): Payer: BC Managed Care – PPO | Admitting: Family Medicine

## 2011-12-07 ENCOUNTER — Ambulatory Visit: Payer: Self-pay | Admitting: Family Medicine

## 2011-12-07 ENCOUNTER — Encounter: Payer: Self-pay | Admitting: Family Medicine

## 2011-12-07 VITALS — BP 146/88 | HR 69 | Temp 98.0°F | Wt 198.0 lb

## 2011-12-07 DIAGNOSIS — I10 Essential (primary) hypertension: Secondary | ICD-10-CM

## 2011-12-07 DIAGNOSIS — Z1211 Encounter for screening for malignant neoplasm of colon: Secondary | ICD-10-CM

## 2011-12-07 DIAGNOSIS — Z0000 Encounter for general adult medical examination without abnormal findings: Secondary | ICD-10-CM

## 2011-12-07 DIAGNOSIS — M109 Gout, unspecified: Secondary | ICD-10-CM

## 2011-12-07 DIAGNOSIS — Z8042 Family history of malignant neoplasm of prostate: Secondary | ICD-10-CM

## 2011-12-07 DIAGNOSIS — E785 Hyperlipidemia, unspecified: Secondary | ICD-10-CM

## 2011-12-07 DIAGNOSIS — J029 Acute pharyngitis, unspecified: Secondary | ICD-10-CM

## 2011-12-07 DIAGNOSIS — E039 Hypothyroidism, unspecified: Secondary | ICD-10-CM

## 2011-12-07 DIAGNOSIS — R55 Syncope and collapse: Secondary | ICD-10-CM

## 2011-12-07 DIAGNOSIS — Q898 Other specified congenital malformations: Secondary | ICD-10-CM

## 2011-12-07 MED ORDER — DICLOFENAC SODIUM 50 MG PO TBEC: 50.0000 mg | DELAYED_RELEASE_TABLET | Freq: Two times a day (BID) | ORAL | Status: DC | PRN

## 2011-12-07 MED ORDER — COLCHICINE 0.6 MG PO TABS: ORAL_TABLET | ORAL | Status: DC

## 2011-12-07 MED ORDER — FEBUXOSTAT 40 MG PO TABS: 40.0000 mg | ORAL_TABLET | Freq: Every day | ORAL | Status: DC

## 2011-12-07 MED ORDER — TELMISARTAN-AMLODIPINE 80-5 MG PO TABS: 1.0000 | ORAL_TABLET | Freq: Every day | ORAL | Status: DC

## 2011-12-07 MED ORDER — LEVOTHYROXINE SODIUM 175 MCG PO TABS: 175.0000 ug | ORAL_TABLET | Freq: Every day | ORAL | Status: DC

## 2011-12-07 MED ORDER — FLUTICASONE PROPIONATE 50 MCG/ACT NA SUSP: NASAL | Status: DC

## 2011-12-07 NOTE — Patient Instructions (Addendum)
I would get a flu shot each fall.  I would get a tetanus shot, pneumonia shot, and check with your insurance to see if they will cover the shingles shot.   Keep a log of your BP readings and take it to the appointment at the kidney clinic next month.   Take care.  Let me know if you have concerns.   Drink plenty of fluids, gargle with warm salt water for your throat.  Use nasal saline for the congestion along with flonase.  This should gradually improve.  Take care.   You can get your results through our phone system.  Follow the instructions on the blue card.

## 2011-12-07 NOTE — Progress Notes (Signed)
CPE- See plan.  Routine anticipatory guidance given to patient.  See health maintenance.  URI sx started about 5 days ago.  Scratchy throat is better, and hearing is affected.  Nose if stuffy.  "But I feel fine."  No fevers.  Minimal cough, it's really throat clearing.  No sputum.  Some rhinorrhea.    FH prostate cancer. Due for labs.  No dysuria.  No known blood in urine recently.    Colon cancer screening.  D/w patient JY:[id number] for colon cancer screening, including IFOB vs. colonoscopy.  Risks and benefits of both were discussed and patient voiced understanding.  Pt elects for: IFOB.    FH CKD- per Dr. Briant Cedar.    Gout.  Controlled.  No ADE on meds.  Doing well.    Hypothyroid.  No mass in neck.  No dysphagia.  Due for labs.    Hypertension:    Using medication without problems or lightheadedness: yes Chest pain with exertion:no Edema:no Short of breath:no Average home BPs: occ checked, ~140s/80s He's cutting out salt.    PMH and SH reviewed  Meds, vitals, and allergies reviewed.   ROS: See HPI.  Otherwise negative.    GEN: nad, alert and oriented, hard of hearing HEENT: mucous membranes moist, tm wnl, nasal exam stuffy, OP with tonsillar enlargement and small amount of exudates B NECK: supple w/o LA CV: rrr. Murmur noted PULM: ctab, no inc wob ABD: soft, +bs EXT: no edema SKIN: no acute rash Prostate gland firm and smooth, no enlargement, nodularity, tenderness, mass, asymmetry or induration.  RST neg.

## 2011-12-11 ENCOUNTER — Encounter: Payer: Self-pay | Admitting: *Deleted

## 2011-12-11 ENCOUNTER — Encounter: Payer: Self-pay | Admitting: Family Medicine

## 2011-12-11 DIAGNOSIS — Z0000 Encounter for general adult medical examination without abnormal findings: Secondary | ICD-10-CM | POA: Insufficient documentation

## 2011-12-11 DIAGNOSIS — Z1211 Encounter for screening for malignant neoplasm of colon: Secondary | ICD-10-CM | POA: Insufficient documentation

## 2011-12-11 DIAGNOSIS — Z8042 Family history of malignant neoplasm of prostate: Secondary | ICD-10-CM | POA: Insufficient documentation

## 2011-12-11 NOTE — Assessment & Plan Note (Signed)
Per nephro

## 2011-12-11 NOTE — Assessment & Plan Note (Signed)
D/w patient re:options for colon cancer screening, including IFOB vs. colonoscopy.  Risks and benefits of both were discussed and patient voiced understanding.  Pt elects for:IFOB  

## 2011-12-11 NOTE — Assessment & Plan Note (Signed)
Elevated, needs to work on diet and exercise. Recheck in 6 months

## 2011-12-11 NOTE — Assessment & Plan Note (Signed)
Controlled, continue current meds.  See notes on labs.   

## 2011-12-11 NOTE — Assessment & Plan Note (Signed)
With prev neg u/s done.

## 2011-12-11 NOTE — Assessment & Plan Note (Signed)
Healthy habits encouraged, including vaccination.  See instructions.

## 2011-12-11 NOTE — Assessment & Plan Note (Signed)
PSA wnl, DRE wnl.

## 2011-12-11 NOTE — Assessment & Plan Note (Signed)
No change in meds.  See notes on labs. 

## 2011-12-11 NOTE — Assessment & Plan Note (Signed)
rst neg, likely viral, nontoxic, supportive tx.

## 2011-12-11 NOTE — Assessment & Plan Note (Signed)
TSH wnl.  No change in meds.

## 2012-01-15 ENCOUNTER — Encounter: Payer: Self-pay | Admitting: Family Medicine

## 2012-06-11 ENCOUNTER — Other Ambulatory Visit (INDEPENDENT_AMBULATORY_CARE_PROVIDER_SITE_OTHER): Payer: BC Managed Care – PPO

## 2012-06-11 DIAGNOSIS — E785 Hyperlipidemia, unspecified: Secondary | ICD-10-CM

## 2012-06-13 ENCOUNTER — Encounter: Payer: Self-pay | Admitting: *Deleted

## 2012-10-17 ENCOUNTER — Encounter: Payer: Self-pay | Admitting: Family Medicine

## 2012-10-17 ENCOUNTER — Ambulatory Visit (INDEPENDENT_AMBULATORY_CARE_PROVIDER_SITE_OTHER): Payer: BC Managed Care – PPO | Admitting: Family Medicine

## 2012-10-17 VITALS — BP 102/66 | HR 68 | Temp 97.9°F | Wt 199.0 lb

## 2012-10-17 DIAGNOSIS — L819 Disorder of pigmentation, unspecified: Secondary | ICD-10-CM

## 2012-10-17 DIAGNOSIS — M722 Plantar fascial fibromatosis: Secondary | ICD-10-CM

## 2012-10-17 DIAGNOSIS — R238 Other skin changes: Secondary | ICD-10-CM

## 2012-10-17 NOTE — Assessment & Plan Note (Signed)
Pt needs to stretch daily.  Given handout.  F/u prn.  He understood.  F/u prn.

## 2012-10-17 NOTE — Assessment & Plan Note (Signed)
Likely atypical bruising pattern.  No concern for gout/vascular disease given the exam.  Would follow clinically.

## 2012-10-17 NOTE — Progress Notes (Signed)
2 days ago palm side of L 4th finger was darker than normal.  Dorsum was normal in appearance.  By yesterday AM the dorsum was also dark.  Today the finger isn't as dark today.  It is less swollen today than 2 days ago.  No pain.  No dec in ROM.  No injury.  He had been working on an engine 2 days ago before he noted the changes.    L foot pain.  6-7 weeks ago with plantar pain.  Worst in AM.  Some better as the days goes on.  No trauma recently.    Meds, vitals, and allergies reviewed.   ROS: See HPI.  Otherwise, noncontributory.  nad ncat L hand with normal inspection except for mild darkening of the skin on the L 4th digit circumferentially   Normal radial pulse, normal cap refill on all fingers, normal ROM on the hand.  No synovitis or tenderness L foot with pain at the origin of the plantar fascia but not ttp o/w. NV intact distally

## 2012-10-17 NOTE — Patient Instructions (Addendum)
Use the stretches for your foot, especially before you get out of bed.  Let me know if you have more changes on your finger.   Take care.

## 2012-10-21 ENCOUNTER — Other Ambulatory Visit: Payer: Self-pay | Admitting: *Deleted

## 2012-10-21 ENCOUNTER — Other Ambulatory Visit: Payer: Self-pay | Admitting: Family Medicine

## 2012-10-21 MED ORDER — COLCHICINE 0.6 MG PO TABS: ORAL_TABLET | ORAL | Status: DC

## 2013-01-14 ENCOUNTER — Other Ambulatory Visit: Payer: Self-pay | Admitting: Family Medicine

## 2013-01-14 NOTE — Telephone Encounter (Signed)
[facility] Uloric[facility]  Last physical 11/2011 and no scheduled future appts.

## 2013-01-15 MED ORDER — FEBUXOSTAT 40 MG PO TABS: 40.0000 mg | ORAL_TABLET | Freq: Every day | ORAL | Status: DC

## 2013-01-15 MED ORDER — LEVOTHYROXINE SODIUM 175 MCG PO TABS: 175.0000 ug | ORAL_TABLET | Freq: Every day | ORAL | Status: DC

## 2013-01-15 NOTE — Telephone Encounter (Signed)
Sent.  Please schedule CPE.  Thanks.  

## 2013-01-16 NOTE — Telephone Encounter (Signed)
LMOVM to schedule CPE. 

## 2013-02-18 ENCOUNTER — Ambulatory Visit (INDEPENDENT_AMBULATORY_CARE_PROVIDER_SITE_OTHER): Payer: BC Managed Care – PPO | Admitting: Family Medicine

## 2013-02-18 ENCOUNTER — Encounter: Payer: Self-pay | Admitting: Family Medicine

## 2013-02-18 VITALS — BP 134/84 | HR 65 | Temp 97.5°F | Wt 197.0 lb

## 2013-02-18 DIAGNOSIS — M79609 Pain in unspecified limb: Secondary | ICD-10-CM

## 2013-02-18 DIAGNOSIS — E039 Hypothyroidism, unspecified: Secondary | ICD-10-CM

## 2013-02-18 DIAGNOSIS — I10 Essential (primary) hypertension: Secondary | ICD-10-CM

## 2013-02-18 DIAGNOSIS — N183 Chronic kidney disease, stage 3 unspecified: Secondary | ICD-10-CM

## 2013-02-18 DIAGNOSIS — M109 Gout, unspecified: Secondary | ICD-10-CM

## 2013-02-18 MED ORDER — LEVOTHYROXINE SODIUM 175 MCG PO TABS: 175.0000 ug | ORAL_TABLET | Freq: Every day | ORAL | Status: DC

## 2013-02-18 MED ORDER — COLCHICINE 0.6 MG PO TABS: ORAL_TABLET | ORAL | Status: DC

## 2013-02-18 MED ORDER — TELMISARTAN-AMLODIPINE 80-5 MG PO TABS: 1.0000 | ORAL_TABLET | Freq: Every day | ORAL | Status: DC

## 2013-02-18 MED ORDER — FLUTICASONE PROPIONATE 50 MCG/ACT NA SUSP: NASAL | Status: DC

## 2013-02-18 MED ORDER — FEBUXOSTAT 40 MG PO TABS: 40.0000 mg | ORAL_TABLET | Freq: Every day | ORAL | Status: DC

## 2013-02-18 MED ORDER — DICLOFENAC SODIUM 50 MG PO TBEC: 50.0000 mg | DELAYED_RELEASE_TABLET | Freq: Two times a day (BID) | ORAL | Status: DC | PRN

## 2013-02-18 NOTE — Patient Instructions (Addendum)
Go to the lab on the way out.  We'll contact you with your lab report. Take care.  Don't change your meds for now.  Recheck in:

## 2013-02-18 NOTE — Progress Notes (Signed)
Hypertension:    Using medication without problems or lightheadedness: yes Chest pain with exertion:no Edema:no Short of breath:no Average home BPs: not often checked at home.   Hypothyroid.  No ADE, no neck mass.  Compliant with meds.  Due for labs.   No gout sx.  Doing well with meds.  Compliant with meds.  "if I stop the meds, I can't walk."  Foot pain is better.  He didn't do the stretches.  Plantar fascia likely finally ripped in half pushing a car.  He had pain then, but then resolved 2 days later.  No pain since or now.    Meds, vitals, and allergies reviewed.   PMH and SH reviewed  ROS: See HPI.  Otherwise negative.    GEN: nad, alert and oriented HEENT: mucous membranes moist, no TMG on exam NECK: supple w/o LA CV: rrr. PULM: ctab, no inc wob ABD: soft, +bs EXT: no edema SKIN: no acute rash

## 2013-02-19 ENCOUNTER — Other Ambulatory Visit: Payer: Self-pay | Admitting: Family Medicine

## 2013-02-19 DIAGNOSIS — E039 Hypothyroidism, unspecified: Secondary | ICD-10-CM

## 2013-02-19 MED ORDER — LEVOTHYROXINE SODIUM 175 MCG PO TABS: 175.0000 ug | ORAL_TABLET | Freq: Every day | ORAL | Status: DC

## 2013-02-19 NOTE — Assessment & Plan Note (Signed)
D/w pt about HTN control.

## 2013-02-19 NOTE — Assessment & Plan Note (Signed)
Resolved now. 

## 2013-02-19 NOTE — Assessment & Plan Note (Signed)
Continue as is. Recheck TSH today.

## 2013-02-19 NOTE — Assessment & Plan Note (Signed)
Does well with current meds. Cr has been followed by renal.  Miserable without nsaid.

## 2013-02-19 NOTE — Assessment & Plan Note (Signed)
Continue as is with current meds.  Encouraged to check BP episodically.

## 2013-04-28 ENCOUNTER — Other Ambulatory Visit (INDEPENDENT_AMBULATORY_CARE_PROVIDER_SITE_OTHER): Payer: BC Managed Care – PPO

## 2013-04-28 DIAGNOSIS — E039 Hypothyroidism, unspecified: Secondary | ICD-10-CM

## 2013-04-30 ENCOUNTER — Other Ambulatory Visit: Payer: Self-pay | Admitting: Family Medicine

## 2013-04-30 DIAGNOSIS — E039 Hypothyroidism, unspecified: Secondary | ICD-10-CM

## 2013-04-30 MED ORDER — LEVOTHYROXINE SODIUM 175 MCG PO TABS: 175.0000 ug | ORAL_TABLET | Freq: Every day | ORAL | Status: DC

## 2013-05-22 ENCOUNTER — Telehealth: Payer: Self-pay

## 2013-05-22 NOTE — Telephone Encounter (Signed)
Jola Babinski with [facility] left v/m pt [facility] re;Twynsta was recently sent in as 80-5; pt told pharmacy he was unaware of change to Twynsta 80-10.Please advise.

## 2013-05-22 NOTE — Telephone Encounter (Signed)
Please clarify this.  It appears he is still on the same dose he had had prev.

## 2013-05-23 NOTE — Telephone Encounter (Signed)
The Twynsta 80-10 was written by Dr. Briant Cedar and was written a few days before ours written on 02/18/13.  [facility] [place] [facility] explain to [facility] that that change occurred with Dr. Briant Cedar, not with [place] and that to our knowledge, he should be on Twynsta 80-5.

## 2013-05-25 NOTE — Telephone Encounter (Signed)
Thanks

## 2013-05-26 NOTE — Telephone Encounter (Signed)
Please clarify with pharmacy/pt.  The last time I did anything was on 02/18/13 at the [facility] (when I talked to the patient about it) and I didn't change anything.  I just refilled his meds at that point.

## 2013-05-26 NOTE — Telephone Encounter (Addendum)
Pt called back and wants to know why Dr Para March is refilling the [facility] since Dr Briant Cedar prescribed Monia Sabal 80-10 and that is what pt has been taking 80-10. Pt said he is going to call Dr Mattingly's office to see what dosage of Monia Sabal pt is supposed to be taking and to see if Dr Briant Cedar will refill his med at [facility]. Pt said if he needs anything further he will call Dr Para March back.

## 2013-05-27 NOTE — Telephone Encounter (Addendum)
That has already been established with the pharmacy on the note below.  Patient will call Dr. Edd Arbour office for refills.  Patient advised that Dr. Para March refilled the medication as it was reported to [place].

## 2013-06-14 ENCOUNTER — Ambulatory Visit: Payer: BC Managed Care – PPO | Admitting: Family Medicine

## 2013-06-20 ENCOUNTER — Ambulatory Visit (INDEPENDENT_AMBULATORY_CARE_PROVIDER_SITE_OTHER): Payer: BC Managed Care – PPO | Admitting: Family Medicine

## 2013-06-20 ENCOUNTER — Encounter: Payer: Self-pay | Admitting: Family Medicine

## 2013-06-20 VITALS — BP 150/80 | HR 65 | Temp 97.3°F | Wt 195.8 lb

## 2013-06-20 DIAGNOSIS — R202 Paresthesia of skin: Secondary | ICD-10-CM

## 2013-06-20 DIAGNOSIS — R209 Unspecified disturbances of skin sensation: Secondary | ICD-10-CM

## 2013-06-20 DIAGNOSIS — M549 Dorsalgia, unspecified: Secondary | ICD-10-CM

## 2013-06-20 DIAGNOSIS — E039 Hypothyroidism, unspecified: Secondary | ICD-10-CM

## 2013-06-20 DIAGNOSIS — I10 Essential (primary) hypertension: Secondary | ICD-10-CM

## 2013-06-20 NOTE — Patient Instructions (Addendum)
Let me know about your BP readings over the next week.  See Bonita Quin about your referral before you leave today. Stretch your lower back.  Take care.

## 2013-06-20 NOTE — Progress Notes (Signed)
HTN.  Was lightheaded w/o vertigo.  This is now resolved.  Was happening about 2 weeks ago, usually in AM, none in last 6 days.  No med changes per patient.  D/w pt about his prev concern re:med changes.  I have always reviewed meds with patient and refilled meds when needed and after discussion with patient, re:med and dose. I reviewed the 80/10 vs 80/5 dosing of twynsta. No record of 80/10 in EMR.  I have not changed this, stated this to patient.  No CP, SOB, BLE edema.   L arm pain.  Paresthesia in the L hand and lower L arm.  No weakness. Some L shoulder pain.  No injury.  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  He also has the occ "feeling of bugs crawling on my R shoulder" w/o dec in ROM or injury.  He has no neck injury.   Hypothyroidism, due for recheck TSH.  Dose prev adjusted.    He complains of back "tiredness" in his lower back w/o BLE leg weakness and w/o injury. He isn't stretching his lower back.   Meds, vitals, and allergies reviewed.   ROS: See HPI.  Otherwise, noncontributory.  nad ncat Mmm Neck supple, no TMG, normal ROM of neck rrr ctab abd soft Ext w/o edema L popeye noted Normal ROM on the shoulders, elbows, wrists, hands.  No weakness in the BUE Normal gait phalen and tinels wnl B Skin w/o rash.  Back w/o midline pain

## 2013-06-22 ENCOUNTER — Encounter: Payer: Self-pay | Admitting: Family Medicine

## 2013-06-22 DIAGNOSIS — M549 Dorsalgia, unspecified: Secondary | ICD-10-CM | POA: Insufficient documentation

## 2013-06-22 DIAGNOSIS — R202 Paresthesia of skin: Secondary | ICD-10-CM | POA: Insufficient documentation

## 2013-06-22 NOTE — Assessment & Plan Note (Signed)
Recheck TSH today.  

## 2013-06-22 NOTE — Assessment & Plan Note (Signed)
Refer to ortho.  No weakness. No emergent sx.  D/w pt in detail.

## 2013-06-22 NOTE — Assessment & Plan Note (Signed)
Rarely checks BP at home.  Needs to do so and report back.  Was prev lightheaded w/o vertigo. This is now resolved. No med changes per patient. D/w pt about his prev concern re:med changes. I have always reviewed meds with patient and refilled meds when needed and after discussion with patient, re:med and dose. I reviewed the 80/10 vs 80/5 dosing of twynsta. No record of 80/10 in EMR. I have not changed this, stated this to patient. No CP, SOB, BLE edema.  >25 min spent with face to face with patient, >50% counseling and/or coordinating care

## 2013-06-22 NOTE — Assessment & Plan Note (Signed)
Likely benign msk source, needs to stretch. Handout given. F/u prn.

## 2013-06-24 ENCOUNTER — Encounter: Payer: Self-pay | Admitting: *Deleted

## 2013-06-30 ENCOUNTER — Other Ambulatory Visit: Payer: BC Managed Care – PPO

## 2013-07-11 ENCOUNTER — Emergency Department (HOSPITAL_COMMUNITY)
Admission: EM | Admit: 2013-07-11 | Discharge: 2013-07-11 | Disposition: A | Discharge status: Home / Self Care | Payer: BC Managed Care – PPO | Attending: Emergency Medicine | Admitting: Emergency Medicine

## 2013-07-11 ENCOUNTER — Encounter (HOSPITAL_COMMUNITY): Payer: Self-pay | Admitting: Emergency Medicine

## 2013-07-11 DIAGNOSIS — M109 Gout, unspecified: Secondary | ICD-10-CM | POA: Insufficient documentation

## 2013-07-11 DIAGNOSIS — R011 Cardiac murmur, unspecified: Secondary | ICD-10-CM | POA: Insufficient documentation

## 2013-07-11 DIAGNOSIS — Z862 Personal history of diseases of the blood and blood-forming organs and certain disorders involving the immune mechanism: Secondary | ICD-10-CM | POA: Insufficient documentation

## 2013-07-11 DIAGNOSIS — E079 Disorder of thyroid, unspecified: Secondary | ICD-10-CM | POA: Insufficient documentation

## 2013-07-11 DIAGNOSIS — N189 Chronic kidney disease, unspecified: Secondary | ICD-10-CM | POA: Insufficient documentation

## 2013-07-11 DIAGNOSIS — I129 Hypertensive chronic kidney disease with stage 1 through stage 4 chronic kidney disease, or unspecified chronic kidney disease: Secondary | ICD-10-CM | POA: Insufficient documentation

## 2013-07-11 DIAGNOSIS — Z79899 Other long term (current) drug therapy: Secondary | ICD-10-CM | POA: Insufficient documentation

## 2013-07-11 DIAGNOSIS — Z8639 Personal history of other endocrine, nutritional and metabolic disease: Secondary | ICD-10-CM | POA: Insufficient documentation

## 2013-07-11 DIAGNOSIS — R55 Syncope and collapse: Secondary | ICD-10-CM

## 2013-07-11 LAB — BASIC METABOLIC PANEL
BUN: 18 mg/dL (ref 6–23)
CO2: 25 mEq/L (ref 19–32)
Calcium: 9.5 mg/dL (ref 8.4–10.5)
Chloride: 106 mEq/L (ref 96–112)
Creatinine, Ser: 1.37 mg/dL — ABNORMAL HIGH (ref 0.50–1.35)
GFR calc Af Amer: 61 mL/min — ABNORMAL LOW (ref >90)
GFR calc non Af Amer: 53 mL/min — ABNORMAL LOW (ref >90)
Glucose, Bld: 97 mg/dL (ref 70–99)
Potassium: 4.9 mEq/L (ref 3.5–5.1)
Sodium: 140 mEq/L (ref 135–145)

## 2013-07-11 LAB — CBC WITH DIFFERENTIAL/PLATELET
Basophils Absolute: 0 10*3/uL (ref 0.0–0.1)
Basophils Relative: 0 % (ref 0–1)
Eosinophils Absolute: 0.2 10*3/uL (ref 0.0–0.7)
Eosinophils Relative: 2 % (ref 0–5)
HCT: 39.1 % (ref 39.0–52.0)
Hemoglobin: 14.3 g/dL (ref 13.0–17.0)
Lymphocytes Relative: 12 % (ref 12–46)
Lymphs Abs: 1.1 10*3/uL (ref 0.7–4.0)
MCH: 32.1 pg (ref 26.0–34.0)
MCHC: 36.6 g/dL — ABNORMAL HIGH (ref 30.0–36.0)
MCV: 87.7 fL (ref 78.0–100.0)
Monocytes Absolute: 0.6 10*3/uL (ref 0.1–1.0)
Monocytes Relative: 6 % (ref 3–12)
Neutro Abs: 7.9 10*3/uL — ABNORMAL HIGH (ref 1.7–7.7)
Neutrophils Relative %: 81 % — ABNORMAL HIGH (ref 43–77)
Platelets: 177 10*3/uL (ref 150–400)
RBC: 4.46 MIL/uL (ref 4.22–5.81)
RDW: 13 % (ref 11.5–15.5)
WBC: 9.9 10*3/uL (ref 4.0–10.5)

## 2013-07-11 LAB — URINALYSIS, ROUTINE W REFLEX MICROSCOPIC
Bilirubin Urine: NEGATIVE
Glucose, UA: NEGATIVE mg/dL
Ketones, ur: NEGATIVE mg/dL
Leukocytes, UA: NEGATIVE
Nitrite: NEGATIVE
Protein, ur: 100 mg/dL — AB
Specific Gravity, Urine: 1.013 (ref 1.005–1.030)
Urobilinogen, UA: 0.2 mg/dL (ref 0.0–1.0)
pH: 7 (ref 5.0–8.0)

## 2013-07-11 LAB — URINE MICROSCOPIC-ADD ON

## 2013-07-11 MED ORDER — SODIUM CHLORIDE 0.9 % IV BOLUS (SEPSIS)
1000.0000 mL | Freq: Once | INTRAVENOUS | Status: AC
  Administered 2013-07-11: 1000 mL via INTRAVENOUS

## 2013-07-11 NOTE — ED Notes (Signed)
Patient is resting comfortably. 

## 2013-07-11 NOTE — ED Notes (Signed)
Family at bedside. 

## 2013-07-11 NOTE — ED Notes (Signed)
According to EMS, Patient was having his blood drawn his kidney doctor's office  [facility]) and he vagaled after his blood was drawn.  Personnel advised he went leaned back in the chair, got clammy and so they called EMS to be transported to [facility].  Patient never lost consciousness.  He was transported her to be evaluated.

## 2013-07-11 NOTE — ED Provider Notes (Signed)
CSN: [id number]     Arrival date & time 07/11/13  1052 History     First MD Initiated Contact with Patient 07/11/13 1116     Chief Complaint  Patient presents with  . Weakness    Patient vagaled after blood draw, never LOC   (Consider location/radiation/quality/duration/timing/severity/associated sxs/prior Treatment) HPI Patient presents emergency department following a near-syncopal event at the doctor's office .  Patient was having a blood draw done when the nurse was unable to obtain the blood.  She was Poking him and he had an event, where he got weak, and he either passed out or was close to passing out they were unsure.  Patient denies chest pain, shortness of breath, nausea, vomiting, weakness, abdominal pain, headache, blurred vision, back pain, neck pain, fever, or dizziness.  Patient has lots of multiple complaints that his doctor has been working with  Past Medical History  Diagnosis Date  . Hypertension   . Gout   . Thyroid disease   . Hyperlipidemia   . CKD (chronic kidney disease)     followed by Dr. Briant Cedar with renal  . Heart murmur     with unremarkable echo 2012   Past Surgical History  Procedure Laterality Date  . US renal/aorta  4/97    Left cyst  . Cystoscopy  3/85    IVP - Microhematuria, prost inflammation, (-)  . Right renal artery [place] (-),  abd [place](-)  05/03/98    Left renal stenosis  . Hospital  02/11/00    Syncope  . Stress cardiolite  3/01    Normal  . Left cataract with iol  10/06   Family History  Problem Relation Age of Onset  . Diabetes Mother   . Kidney disease Mother     Renal disease, dialysis  . Kidney disease Sister     renal disease, dialysis  . Prostate cancer Paternal Uncle   . Colon cancer Neg Hx    History  Substance Use Topics  . Smoking status: Never Smoker   . Smokeless tobacco: Not on file  . Alcohol Use: No    Review of Systems All other systems negative except as documented in the HPI. All pertinent positives and  negatives as reviewed in the HPI. Allergies  Allopurinol and Hydrochlorothiazide  Home Medications   Current Outpatient Rx  Name  Route  Sig  Dispense  Refill  . amLODipine (NORVASC) 2.5 MG tablet   Oral   Take 2.5 mg by mouth daily.         . colchicine 0.6 MG tablet      Take one tablet daily and then as needed for gout flare, may repeat in one hour x 1   90 tablet   3   . diclofenac (VOLTAREN) 50 MG EC tablet   Oral   Take 1 tablet (50 mg total) by mouth 2 (two) times daily as needed.   180 tablet   3   . febuxostat (ULORIC) 40 MG tablet   Oral   Take 1 tablet (40 mg total) by mouth daily. Allopurinol intolerant   90 tablet   3   . levothyroxine (SYNTHROID, LEVOTHROID) 150 MCG tablet   Oral   Take 150 mcg by mouth daily before breakfast.         . Telmisartan-Amlodipine (TWYNSTA) 80-5 MG TABS   Oral   Take 1 tablet by mouth daily.   90 tablet   3    BP 129/106  Pulse 66  Temp(Src) 97.5 F (36.4 C) (Oral)  Resp 19  SpO2 99% Physical Exam  Nursing note and vitals reviewed. Constitutional: He is oriented to person, place, and time. He appears well-developed and well-nourished.  HENT:  Head: Normocephalic and atraumatic.  Mouth/Throat: Oropharynx is clear and moist.  Eyes: Pupils are equal, round, and reactive to light.  Neck: Normal range of motion. Neck supple.  Cardiovascular: Normal rate, regular rhythm, normal heart sounds and intact distal pulses.  Exam reveals no gallop and no friction rub.   No murmur heard. Pulmonary/Chest: Effort normal and breath sounds normal.  Abdominal: Soft. Bowel sounds are normal. He exhibits no distension. There is no tenderness.  Musculoskeletal: He exhibits no edema.  Neurological: He is alert and oriented to person, place, and time. He exhibits normal muscle tone. Coordination normal.  Skin: Skin is warm and dry. No rash noted. No erythema.    ED Course   Procedures (including critical care time)  Labs  Reviewed  CBC WITH DIFFERENTIAL - Abnormal; Notable for the following:    MCHC 36.6 (*)    Neutrophils Relative % 81 (*)    Neutro Abs 7.9 (*)    All other components within normal limits  BASIC METABOLIC PANEL - Abnormal; Notable for the following:    Creatinine, Ser 1.37 (*)    GFR calc non Af Amer 53 (*)    GFR calc Af Amer 61 (*)    All other components within normal limits  URINALYSIS, ROUTINE W REFLEX MICROSCOPIC - Abnormal; Notable for the following:    Hgb urine dipstick TRACE (*)    Protein, ur 100 (*)    All other components within normal limits  URINE MICROSCOPIC-ADD ON   Patient has had similar episodes with blood draws in the past.  He'll be referred back to his primary care Dr. told to return here as needed.  Patient is advised to return here as needed.  Patient is feeling better here in the emergency department with no symptoms  MDM  MDM Reviewed: nursing note and vitals Interpretation: labs and ECG   Date: 07/11/2013  Rate: 49  Rhythm: sinus bradycardia  QRS Axis: normal  Intervals: normal  ST/T Wave abnormalities: normal  Conduction Disutrbances:none  Narrative Interpretation:   Old EKG Reviewed: unchanged      Carlyle Dolly, PA-C 07/11/13 1507

## 2013-07-11 NOTE — ED Provider Notes (Signed)
Medical screening examination/treatment/procedure(s) were performed by non-physician practitioner and as supervising physician I was immediately available for consultation/collaboration.   Darlys Gales, MD 07/11/13 2249

## 2013-07-11 NOTE — ED Notes (Signed)
Ordered Lunch tray(reg)

## 2013-08-08 ENCOUNTER — Encounter: Payer: Self-pay | Admitting: Family Medicine

## 2013-08-08 ENCOUNTER — Ambulatory Visit (INDEPENDENT_AMBULATORY_CARE_PROVIDER_SITE_OTHER): Payer: BC Managed Care – PPO | Admitting: Family Medicine

## 2013-08-08 VITALS — BP 128/72 | HR 68 | Temp 97.7°F | Wt 192.5 lb

## 2013-08-08 NOTE — Progress Notes (Signed)
Pt came in to talk about his meds.  It took extraordinary effort to verify his current meds as he is taking them.  Again he questioned my refilling his twynsta.  Again I referred to the fact that I always verify his meds and doses at the visit.  He is given an updated and printed list of his meds and doses at every visit.  I go on his report re: the way he is taking his medicines.  If should be noted that if gives false or incorrect information, then I may not be able to correct that at the visit.  I told him that if he gives false information, that would be his fault.   Pt was noncompliant by not following directions re: call back at last OV.  I told him that.   To recap recent events as he reports them:  He usually has R elbow pain from gout if he stops the colchicine.  He has frequent flares if he stops the colchicine, usually about 10 days after stopping the medicine.   He stoped the colchicine per renal MD rec.  Seen at ER after likely vagal episode on the day of the last renal visit.   10 days later at ortho office, he was put on prednisone.  Pt thought he had sciatia per ortho report.    The elbow pain got better on pred per patient.  Now he's "back where he started".  He restarted the colchicine in the meantime.   He continues to have pain and paresthesias in his legs.    I put all his meds on the table to verify how he reports taking them currently.  Med list updated.    Meds, vitals, and allergies reviewed.   ROS: See HPI.  Otherwise, noncontributory.  nad Tangential in the interview, needs frequent redirection, but speech intact.  Hard of hearing.  I consistently spoke up so he could hear me.  remainder of exam deferred.

## 2013-08-08 NOTE — Patient Instructions (Addendum)
Call Dr. Jeannetta Ellis office about the the nerve pain.  I'll talk to renal about the colchicine.

## 2013-08-09 ENCOUNTER — Encounter: Payer: Self-pay | Admitting: Family Medicine

## 2013-08-09 NOTE — Assessment & Plan Note (Signed)
1. Pt complains of price re: twynsta.  He is to call renal clinic about this.  2. I didn't change his meds.  I'll talk to renal about his gout meds, since he seems to need the colchicine.  3. Pt is to f/u with ortho re: his pain.  Pt questioned my plans and intent.  He asked, "is this all because of you or the insurance company?"  I told him I was doing everything I could for him, regardless of his insurance.  I told him if he questions that response then he needs to find another doctor.   I asked him if he understood the plan above, re:1-3.  He wouldn't answer yes or no initially, he was tangential in his response.  I had to ask him about 20 times re: understanding the plan.  I finally said "do you understand the words coming out of my mouth?"  He finally answered that he did understand, had understood during the interview, and was agreeable to the plan.   He was given a written copy of the plan and his med list.  The patient made this encounter more difficult than necessary by not engaging in a reasonable conversation.   >25 min spent with face to face with patient, >50% counseling and/or coordinating care

## 2013-08-13 ENCOUNTER — Telehealth: Payer: Self-pay | Admitting: Family Medicine

## 2013-08-13 NOTE — Telephone Encounter (Signed)
Called cell and LMOVM.  No answer.  Will try to call back later.  Tried his home number, couldn't leave message.

## 2013-08-14 ENCOUNTER — Encounter: Payer: Self-pay | Admitting: Family Medicine

## 2013-08-14 ENCOUNTER — Telehealth: Payer: Self-pay | Admitting: Family Medicine

## 2013-08-14 DIAGNOSIS — M25529 Pain in unspecified elbow: Secondary | ICD-10-CM

## 2013-08-14 NOTE — Telephone Encounter (Signed)
I called pt.  I d/w pt about my conversation with Dr. Briant Cedar.  It is reasonable to offer rheum eval.  The goal would be to get pt off colchicine and hopefully off diclofenac.  He may have another process other than gout causing the pain and rheum may be able to help with this.  I am happy to offer the referral and he accepted.   Completing this referral is the last item related to his care that I have offered.  I have no outstanding/promised obligations to him.   Additionally I explained that the patient used language at the last OV giving me the impression that he doesn't trust and/or have faith in me.  This means he needs to find another MD.  He is to be dismissed and he'll need to find another PCP.  I told him he'd get a letter in the mail.  He said he understood.    I wished him well and ended the call.

## 2013-08-18 ENCOUNTER — Telehealth: Payer: Self-pay | Admitting: Family Medicine

## 2013-08-18 NOTE — Telephone Encounter (Signed)
Dismissal Letter sent by Certified Mail 08/18/2013  Received the Return Receipt showing someone picked up the Dismissal Letter 08/25/2013

## 2013-09-29 ENCOUNTER — Ambulatory Visit: Payer: Self-pay | Admitting: Rheumatology

## 2013-11-03 ENCOUNTER — Ambulatory Visit (INDEPENDENT_AMBULATORY_CARE_PROVIDER_SITE_OTHER): Payer: Medicare Other | Admitting: Emergency Medicine

## 2013-11-03 ENCOUNTER — Ambulatory Visit: Payer: Medicare Other

## 2013-11-03 VITALS — BP 130/82 | HR 77 | Temp 98.1°F | Resp 16 | Ht 68.0 in | Wt 194.0 lb

## 2013-11-03 DIAGNOSIS — L0390 Cellulitis, unspecified: Secondary | ICD-10-CM

## 2013-11-03 DIAGNOSIS — R51 Headache: Secondary | ICD-10-CM

## 2013-11-03 MED ORDER — MUPIROCIN 2 % EX OINT: 1.0000 "application " | TOPICAL_OINTMENT | Freq: Two times a day (BID) | CUTANEOUS | Status: DC

## 2013-11-03 MED ORDER — DOXYCYCLINE HYCLATE 100 MG PO CAPS: 100.0000 mg | ORAL_CAPSULE | Freq: Two times a day (BID) | ORAL | Status: DC

## 2013-11-03 MED ORDER — AZELASTINE HCL 0.1 % NA SOLN: 2.0000 | Freq: Two times a day (BID) | NASAL | Status: DC

## 2013-11-03 NOTE — Progress Notes (Addendum)
Subjective:    Patient ID: Juan Jennings, male    DOB: October 24, 1946, 67 y.o.   MRN: [id number]  This chart was scribed for Juan Jennings Neisler-MD, by Ladona Ridgel Day, Scribe. This patient was seen in room 11 and the patient's care was started at 10:00 AM.  Sinusitis Pertinent negatives include no chills, congestion, coughing or sore throat.   Juan Jennings is a 67 y.o. male who presents to the [facility] complaining of irritation to his left nare, onset 1 week ago, constant and gradually worsened in what he states "skiin irritation". He reports it feels a little swollen and feels like an aching discomfort inside his left nare. He denies any injury or trauma to his nose. He reports it feels worse at nighttime and discomfort while breathing. He denies fever/chills, cough/col or fever like sx. He reports a similar previous episode. He has not tried any medicines at home for this problem.   Medical Hx: HTN Denies hx of boils or staph infections  Past Medical History  Diagnosis Date  . Hypertension   . Gout   . Thyroid disease   . Hyperlipidemia   . CKD (chronic kidney disease)     followed by Dr. Briant Cedar with renal  . Heart murmur     with unremarkable echo 2012  . Cataract    Past Surgical History  Procedure Laterality Date  . US renal/aorta  4/97    Left cyst  . Cystoscopy  3/85    IVP - Microhematuria, prost inflammation, (-)  . Right renal artery [place] (-),  abd [place](-)  05/03/98    Left renal stenosis  . Hospital  02/11/00    Syncope  . Stress cardiolite  3/01    Normal  . Left cataract with iol  10/06   Family History  Problem Relation Age of Onset  . Diabetes Mother   . Kidney disease Mother     Renal disease, dialysis  . Kidney disease Sister     renal disease, dialysis  . Prostate cancer Paternal Uncle   . Colon cancer Neg Hx    History   Social History  . Marital Status: Single    Spouse Name: N/A    Number of Children: N/A  . Years of Education: N/A     Occupational History  . Not on file.   Social History Main Topics  . Smoking status: Never Smoker   . Smokeless tobacco: Not on file  . Alcohol Use: No  . Drug Use: No  . Sexual Activity: Not on file   Other Topics Concern  . Not on file   Social History Narrative   Divorced   Moving to [place] in near future 2/09 but will keep MDs here in [place].     [employer] fan, former racer   Allergies  Allergen Reactions  . Allopurinol Other (See Comments)    Unknown   . Hydrochlorothiazide     REACTION: "body on fire"   Patient Active Problem List   Diagnosis Date Noted  . Back pain 06/22/2013  . Paresthesia 06/22/2013  . Discoloration of skin of finger 10/17/2012  . Plantar fasciitis 10/17/2012  . Routine general medical examination at a health care facility 12/11/2011  . FH: prostate cancer 12/11/2011  . Colon cancer screening 12/11/2011  . Murmur, cardiac 04/21/2011  . DERMATITIS, ALLERGIC 09/01/2010  . CRAMP OF LIMB 09/01/2010  . KNEE PAIN, RIGHT 01/19/2010  . TRANSAMINASES, SERUM, ELEVATED 09/21/2009  .  PERSONAL HISTORY ENDOCRN METABOLIC&IMMUNITY D/O 07/08/2009  . FOOT PAIN 05/07/2009  . DEGENERATIVE JOINT DISEASE, KNEE 09/04/2008  . HYPERLIPIDEMIA 11/25/2007  . RENAL ARTERY STENOSIS 11/25/2007  . 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 HEMATURIA, MICROSCOPIC, HX OF 11/25/2007  . HYPOTHYROIDISM 11/16/2007  . HYPERTENSION 11/16/2007  . Chronic kidney disease, stage III (moderate) 08/28/2007  . GOUT 07/25/2007  . EFFUSION, JOINT, OTHER [facility] SITE 07/25/2007   Meds ordered this encounter  Medications  . gabapentin (NEURONTIN) 300 MG capsule    Sig: Take 300 mg by mouth 2 (two) times daily.   Review of Systems  Constitutional: Negative for fever and chills.  HENT: Negative for congestion, rhinorrhea and sore throat.        Left nare discomfort and swelling  Respiratory: Negative for cough.   Cardiovascular: Negative for chest pain.  Gastrointestinal: Negative for  abdominal pain.   Triage Vitals: BP 130/82  Pulse 77  Temp(Src) 98.1 F (36.7 C) (Oral)  Resp 16  Ht 5\' 8"  (1.727 m)  Wt 194 lb (87.998 kg)  BMI 29.50 kg/m2  SpO2 100%     Objective:   Physical Exam Physical Exam  Nursing note and vitals reviewed. Constitutional: Patient is oriented to person, place, and time. Patient appears well-developed and well-nourished. No distress.  HENT: Patient is tender across the bridge of the nose. The turbinates are swollen but there is no purulence noted.  Head: Normocephalic and atraumatic.  Neck: Neck supple. No tracheal deviation present.  Cardiovascular: Normal rate, regular rhythm and normal heart sounds.   No murmur heard. Pulmonary/Chest: Effort normal and breath sounds normal. No respiratory distress. Patient has no wheezes. Patient has no rales.  Musculoskeletal: Normal range of motion.  Neurological: Patient is alert and oriented to person, place, and time.  Skin: Skin is warm and dry.  Psychiatric: Patient has a normal mood and affect. Patient's behavior is normal.  [facility] reading (PRIMARY) by  Dr.Milicent Acheampong maxillary sinuses are normal. Please comment on the frontal sinus on the lateral view as to whether this represents a frontal sinusitis.      Assessment & Plan:  I personally performed the services described in this documentation, which was scribed in my presence. The recorded information has been reviewed and is accurate. The patient has what appears to be an infection at the bridge of the nose. I suspect this is secondary to staph.

## 2013-11-10 ENCOUNTER — Ambulatory Visit: Payer: Self-pay | Admitting: Physical Medicine and Rehabilitation

## 2013-11-24 ENCOUNTER — Encounter: Payer: Self-pay | Admitting: *Deleted

## 2013-12-09 ENCOUNTER — Telehealth: Payer: Self-pay

## 2013-12-09 NOTE — Telephone Encounter (Signed)
Juan Jennings with [facility] left v/m requesting prior auth [employer]. Spoke with Juan [facility] at [facility] and advised Juan Jennings is no longer a pt and [facility] to ck with pt about PCP.Juan Jennings voiced understanding.

## 2013-12-12 ENCOUNTER — Ambulatory Visit: Payer: Self-pay | Admitting: Physical Medicine and Rehabilitation

## 2014-03-13 ENCOUNTER — Ambulatory Visit: Payer: Self-pay | Admitting: Physical Medicine and Rehabilitation

## 2014-04-06 ENCOUNTER — Ambulatory Visit: Payer: Self-pay | Admitting: Internal Medicine

## 2014-05-20 ENCOUNTER — Other Ambulatory Visit: Payer: Self-pay | Admitting: Neurosurgery

## 2014-05-27 ENCOUNTER — Encounter (HOSPITAL_COMMUNITY): Payer: Self-pay | Admitting: Pharmacy Technician

## 2014-05-28 NOTE — Pre-Procedure Instructions (Addendum)
Juan Jennings  05/28/2014   Your procedure is scheduled on: Wednesday, June 10, 2014  Report [facility] Admitting at 1000 AM.  Call this number if you have problems the morning of surgery: [phone]   Remember:   Do not eat food or drink liquids after midnight Tuesday, June 09, 2014   Take these medicines the morning of surgery with A SIP OF WATER: colchicine  gabapentin (NEURONTIN), levothyroxine (SYNTHROID), if needed:azelastine (ASTELIN) nasal spray for allergies.     Take all meds as ordered until day of surgery except as instrucred below or per dr  Stop taking Aspirin, vitamins and herbal medications. Do not take any NSAIDs ie: Ibuprofen, Advil, Naproxen or any medication containing Aspirin (diclofenac (VOLTAREN); stop 5 days prior to procedure ( Fri. 06/05/14)including vitamins ,aspirin  Do not wear jewelry, make-up or nail polish.  Do not wear lotions, powders, or perfumes. You may wear deodorant.  Do not shave 48 hours prior to surgery. Men may shave face and neck.  Do not bring valuables to the hospital.  [facility] is not responsible for any belongings or valuables.               Contacts, dentures or bridgework may not be worn into surgery.  Leave suitcase in the car. After surgery it may be brought to your room.  For patients admitted to the hospital, discharge time is determined by your treatment team.               Patients discharged the day of surgery will not be allowed to drive home.  Name and phone number of your driver:   Special Instructions:  Special Instructions:Special Instructions: [facility] - Preparing for Surgery  Before surgery, you can play an important role.  Because skin is not sterile, your skin needs to be as free of germs as possible.  You can reduce the number of germs on you skin by washing with CHG (chlorahexidine gluconate) soap before surgery.  CHG is an antiseptic cleaner which kills germs and bonds with the skin to continue  killing germs even after washing.  Please DO NOT use if you have an allergy to CHG or antibacterial soaps.  If your skin becomes reddened/irritated stop using the CHG and inform your nurse when you arrive at Short Stay.  Do not shave (including legs and underarms) for at least 48 hours prior to the first CHG shower.  You may shave your face.  Please follow these instructions carefully:   1.  Shower with CHG Soap the night before surgery and the morning of Surgery.  2.  If you choose to wash your hair, wash your hair first as usual with your normal shampoo.  3.  After you shampoo, rinse your hair and body thoroughly to remove the Shampoo.  4.  Use CHG as you would any other liquid soap.  You can apply chg directly  to the skin and wash gently with scrungie or a clean washcloth.  5.  Apply the CHG Soap to your body ONLY FROM THE NECK DOWN.  Do not use on open wounds or open sores.  Avoid contact with your eyes, ears, mouth and genitals (private parts).  Wash genitals (private parts) with your normal soap.  6.  Wash thoroughly, paying special attention to the area where your surgery will be performed.  7.  Thoroughly rinse your body with warm water from the neck down.  8.  DO NOT shower/wash with  your normal soap after using and rinsing off the CHG Soap.  9.  Pat yourself dry with a clean towel.            10.  Wear clean pajamas.            11.  Place clean sheets on your bed the night of your first shower and do not sleep with pets.  Day of Surgery  Do not apply any lotions the morning of surgery.  Please wear clean clothes to the hospital/surgery center.   Please read over the following fact sheets that you were given: Pain Booklet, Coughing and Deep Breathing, MRSA Information and Surgical Site Infection Prevention

## 2014-06-01 ENCOUNTER — Encounter (HOSPITAL_COMMUNITY)
Admission: RE | Admit: 2014-06-01 | Discharge: 2014-06-01 | Disposition: A | Discharge status: Home / Self Care | Payer: Medicare Other | Source: Ambulatory Visit | Attending: Anesthesiology | Admitting: Anesthesiology

## 2014-06-01 ENCOUNTER — Encounter (HOSPITAL_COMMUNITY)
Admission: RE | Admit: 2014-06-01 | Discharge: 2014-06-01 | Disposition: A | Discharge status: Home / Self Care | Payer: Medicare Other | Source: Ambulatory Visit | Attending: Neurosurgery | Admitting: Neurosurgery

## 2014-06-01 ENCOUNTER — Encounter (HOSPITAL_COMMUNITY): Payer: Self-pay

## 2014-06-01 DIAGNOSIS — Z01812 Encounter for preprocedural laboratory examination: Secondary | ICD-10-CM | POA: Insufficient documentation

## 2014-06-01 DIAGNOSIS — Z01818 Encounter for other preprocedural examination: Secondary | ICD-10-CM | POA: Insufficient documentation

## 2014-06-01 HISTORY — DX: Unspecified osteoarthritis, unspecified site: M19.90

## 2014-06-01 HISTORY — DX: Hypothyroidism, unspecified: E03.9

## 2014-06-01 LAB — BASIC METABOLIC PANEL
ANION GAP: 13 (ref 5–15)
BUN: 22 mg/dL (ref 6–23)
CALCIUM: 9.2 mg/dL (ref 8.4–10.5)
CO2: 24 mEq/L (ref 19–32)
Chloride: 106 mEq/L (ref 96–112)
Creatinine, Ser: 1.53 mg/dL — ABNORMAL HIGH (ref 0.50–1.35)
GFR calc Af Amer: 53 mL/min — ABNORMAL LOW (ref >90)
GFR, EST NON AFRICAN AMERICAN: 46 mL/min — AB (ref >90)
Glucose, Bld: 92 mg/dL (ref 70–99)
Potassium: 4.8 mEq/L (ref 3.7–5.3)
SODIUM: 143 meq/L (ref 137–147)

## 2014-06-01 LAB — SURGICAL PCR SCREEN
MRSA, PCR: NEGATIVE
STAPHYLOCOCCUS AUREUS: NEGATIVE

## 2014-06-01 LAB — CBC
HCT: 39.4 % (ref 39.0–52.0)
Hemoglobin: 13.7 g/dL (ref 13.0–17.0)
MCH: 31.6 pg (ref 26.0–34.0)
MCHC: 34.8 g/dL (ref 30.0–36.0)
MCV: 91 fL (ref 78.0–100.0)
PLATELETS: 167 10*3/uL (ref 150–400)
RBC: 4.33 MIL/uL (ref 4.22–5.81)
RDW: 12.9 % (ref 11.5–15.5)
WBC: 7.4 10*3/uL (ref 4.0–10.5)

## 2014-06-01 NOTE — Progress Notes (Signed)
REQ'D STRESS TEST DONE RECENTLY AT [facility] [place]

## 2014-06-01 NOTE — Progress Notes (Signed)
06/01/14 0927  OBSTRUCTIVE SLEEP APNEA  Have you ever been diagnosed with sleep apnea through a sleep study? No  Do you snore loudly (loud enough to be heard through closed doors)?  0  Do you often feel tired, fatigued, or sleepy during the daytime? 0  Has anyone observed you stop breathing during your sleep? 0  Do you have, or are you being treated for high blood pressure? 1  BMI more than 35 kg/m2? 0  Age over 66 years old? 1  Neck circumference greater than 40 cm/16 inches? 1 (17)  Gender: 1  Obstructive Sleep Apnea Score 4  Score 4 or greater  Results sent to PCP

## 2014-06-02 NOTE — Progress Notes (Addendum)
Anesthesia Chart Review:  Patient is a 68 year old male scheduled for C4-5 ACDF on 06/10/14 by Dr. Lovell SheehanJenkins.    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 Aram BeechamJeffrey Sparks.  Nephrologist is Dr. Briant CedarMattingly. Cardiologist is Dr. Harold HedgeKenneth Fath.  Patient was referred to cardiology [place] 03[place]2014 [place]  Echo, holter, and functional study were unremarkable.  Dr. Lady GaryFath felt patient was "low risk for surgery from a cardiac standpoint.  Would proceed with routine cardiopulmonary monitoring."  EKG on 04/27/14 [facility]) showed: NSR, low voltage QRS, inferior infarct (age undetermined).  By notes, he had a recent echocardiogram read by Dr. Bethann PunchesMark Miller showing "normal left ventricular function with moderate concentric LVH, mild AI and mild MR with normal RV systolic function and pressures."  Report requested.   Nuclear exercise stress test on 05/11/14 showed: No arrhythmia or ischemia. LVEF= 64 % FINDINGS: Regional wall motion: reveals normal myocardial thickening and wall motion. The overall quality of the study is good. Artifacts noted: no Left ventricular cavity: normal. Perfusion Analysis: SPECT images demonstrate homogeneous tracer distribution throughout the myocardium.   CXR on 06/01/14 showed: 1. There is no active cardiopulmonary disease. 2. The findings consistent with previous granulomatous infection.   Preoperative labs noted.  Cr 1.53, up from 1.37 on 07/11/13, but I'll request his most recent nephrology notes as well.  CBC WNL.   He has been cleared by Dr. Lady GaryFath with low CV risk.  He has known CKD with his Cr remaining < 1.6.  I will review any additional records received prior to the day of surgery, but based on currently available information I anticipate that he can proceed as planned with continued post-operative follow-up of his  renal disease.  Velna Ochsllison Zelenak, PA-C [facility] Short Stay Center/Anesthesiology Phone [phone] 06/02/2014 2:54 PM  Addendum: 06/03/2014 2:39 PM His 07/11/13 nephrology office visit note reviewed.  Carotid duplex on 05/15/14 showed: No significant carotid bifurcation plaque or stenosis. [facility] do not have a copy of his recent echo, so request sent to Dr. Judithann SheenSparks office; however the echo summary can be reviewed above or under the Care Everywhere lab.

## 2014-06-09 MED ORDER — CEFAZOLIN SODIUM-DEXTROSE 2-3 GM-% IV SOLR
2.0000 g | INTRAVENOUS | Status: AC
  Administered 2014-06-10: 2 g via INTRAVENOUS

## 2014-06-10 ENCOUNTER — Ambulatory Visit (HOSPITAL_COMMUNITY): Payer: Medicare Other | Admitting: Anesthesiology

## 2014-06-10 ENCOUNTER — Encounter (HOSPITAL_COMMUNITY): Payer: Medicare Other | Admitting: Vascular Surgery

## 2014-06-10 ENCOUNTER — Encounter (HOSPITAL_COMMUNITY): Payer: Self-pay | Admitting: *Deleted

## 2014-06-10 ENCOUNTER — Ambulatory Visit (HOSPITAL_COMMUNITY): Payer: Medicare Other

## 2014-06-10 ENCOUNTER — Ambulatory Visit (HOSPITAL_COMMUNITY)
Admission: RE | Admit: 2014-06-10 | Discharge: 2014-06-11 | Disposition: A | Discharge status: Home / Self Care | Payer: Medicare Other | Source: Ambulatory Visit | Attending: Neurosurgery | Admitting: Neurosurgery

## 2014-06-10 ENCOUNTER — Encounter (HOSPITAL_COMMUNITY)
Admission: RE | Disposition: A | Discharge status: Home / Self Care | Payer: Self-pay | Source: Ambulatory Visit | Attending: Neurosurgery

## 2014-06-10 DIAGNOSIS — R339 Retention of urine, unspecified: Secondary | ICD-10-CM | POA: Diagnosis not present

## 2014-06-10 DIAGNOSIS — I129 Hypertensive chronic kidney disease with stage 1 through stage 4 chronic kidney disease, or unspecified chronic kidney disease: Secondary | ICD-10-CM | POA: Diagnosis not present

## 2014-06-10 DIAGNOSIS — I739 Peripheral vascular disease, unspecified: Secondary | ICD-10-CM | POA: Insufficient documentation

## 2014-06-10 DIAGNOSIS — M109 Gout, unspecified: Secondary | ICD-10-CM | POA: Diagnosis not present

## 2014-06-10 DIAGNOSIS — E039 Hypothyroidism, unspecified: Secondary | ICD-10-CM | POA: Insufficient documentation

## 2014-06-10 DIAGNOSIS — E785 Hyperlipidemia, unspecified: Secondary | ICD-10-CM | POA: Diagnosis not present

## 2014-06-10 DIAGNOSIS — R011 Cardiac murmur, unspecified: Secondary | ICD-10-CM | POA: Diagnosis not present

## 2014-06-10 DIAGNOSIS — H269 Unspecified cataract: Secondary | ICD-10-CM | POA: Insufficient documentation

## 2014-06-10 DIAGNOSIS — M4712 Other spondylosis with myelopathy, cervical region: Secondary | ICD-10-CM | POA: Diagnosis present

## 2014-06-10 DIAGNOSIS — N189 Chronic kidney disease, unspecified: Secondary | ICD-10-CM | POA: Insufficient documentation

## 2014-06-10 DIAGNOSIS — M5000 Cervical disc disorder with myelopathy, unspecified cervical region: Secondary | ICD-10-CM | POA: Diagnosis not present

## 2014-06-10 HISTORY — PX: ANTERIOR CERVICAL DECOMP/DISCECTOMY FUSION: SHX1161

## 2014-06-10 SURGERY — ANTERIOR CERVICAL DECOMPRESSION/DISCECTOMY FUSION 1 LEVEL
Anesthesia: General

## 2014-06-10 MED ORDER — ROCURONIUM BROMIDE 50 MG/5ML IV SOLN
INTRAVENOUS | Status: AC
Start: 2014-06-10 — End: 2014-06-10
  Filled 2014-06-10: qty 1

## 2014-06-10 MED ORDER — POLYETHYLENE GLYCOL 3350 17 G PO PACK
17.0000 g | PACK | Freq: Every day | ORAL | Status: DC
  Filled 2014-06-10 (×2): qty 1

## 2014-06-10 MED ORDER — THROMBIN 5000 UNITS EX SOLR
CUTANEOUS | Status: DC | PRN
  Administered 2014-06-10 (×2): 5000 [IU] via TOPICAL

## 2014-06-10 MED ORDER — ALUM & MAG HYDROXIDE-SIMETH 200-200-20 MG/5ML PO SUSP
30.0000 mL | Freq: Four times a day (QID) | ORAL | Status: DC | PRN
Start: 2014-06-10 — End: 2014-06-11

## 2014-06-10 MED ORDER — AZELASTINE HCL 0.1 % NA SOLN: 2.0000 | Freq: Two times a day (BID) | NASAL | Status: DC | PRN

## 2014-06-10 MED ORDER — ACETAMINOPHEN 325 MG PO TABS: 650.0000 mg | ORAL_TABLET | ORAL | Status: DC | PRN

## 2014-06-10 MED ORDER — LACTATED RINGERS IV SOLN: INTRAVENOUS | Status: DC

## 2014-06-10 MED ORDER — PHENOL 1.4 % MT LIQD: 1.0000 | OROMUCOSAL | Status: DC | PRN

## 2014-06-10 MED ORDER — HEMOSTATIC AGENTS (NO CHARGE) OPTIME
TOPICAL | Status: DC | PRN
  Administered 2014-06-10: 1 via TOPICAL

## 2014-06-10 MED ORDER — DOCUSATE SODIUM 100 MG PO CAPS
100.0000 mg | ORAL_CAPSULE | Freq: Two times a day (BID) | ORAL | Status: DC
Start: 2014-06-10 — End: 2014-06-11
  Administered 2014-06-10 – 2014-06-11 (×2): 100 mg via ORAL
  Filled 2014-06-10 (×3): qty 1

## 2014-06-10 MED ORDER — COLCHICINE 0.6 MG PO TABS
0.6000 mg | ORAL_TABLET | Freq: Every day | ORAL | Status: DC
  Administered 2014-06-11: 0.6 mg via ORAL
  Filled 2014-06-10: qty 1

## 2014-06-10 MED ORDER — MIDAZOLAM HCL 2 MG/2ML IJ SOLN
INTRAMUSCULAR | Status: AC
  Filled 2014-06-10: qty 2

## 2014-06-10 MED ORDER — IRBESARTAN 300 MG PO TABS
300.0000 mg | ORAL_TABLET | Freq: Every day | ORAL | Status: DC
  Administered 2014-06-10 – 2014-06-11 (×2): 300 mg via ORAL
  Filled 2014-06-10 (×2): qty 1

## 2014-06-10 MED ORDER — GLYCOPYRROLATE 0.2 MG/ML IJ SOLN
INTRAMUSCULAR | Status: DC | PRN
  Administered 2014-06-10: 0.6 mg via INTRAVENOUS

## 2014-06-10 MED ORDER — ONDANSETRON HCL 4 MG/2ML IJ SOLN
INTRAMUSCULAR | Status: AC
  Filled 2014-06-10: qty 2

## 2014-06-10 MED ORDER — CEFAZOLIN SODIUM-DEXTROSE 2-3 GM-% IV SOLR
2.0000 g | Freq: Three times a day (TID) | INTRAVENOUS | Status: AC
  Administered 2014-06-10 – 2014-06-11 (×2): 2 g via INTRAVENOUS
  Filled 2014-06-10 (×2): qty 50

## 2014-06-10 MED ORDER — HYDROCODONE-ACETAMINOPHEN 5-325 MG PO TABS: 1.0000 | ORAL_TABLET | ORAL | Status: DC | PRN

## 2014-06-10 MED ORDER — BACITRACIN ZINC 500 UNIT/GM EX OINT
TOPICAL_OINTMENT | CUTANEOUS | Status: DC | PRN
  Administered 2014-06-10: 1 via TOPICAL

## 2014-06-10 MED ORDER — LIDOCAINE HCL (CARDIAC) 20 MG/ML IV SOLN
INTRAVENOUS | Status: DC | PRN
  Administered 2014-06-10: 40 mg via INTRAVENOUS

## 2014-06-10 MED ORDER — HYDROMORPHONE HCL PF 1 MG/ML IJ SOLN: 0.2500 mg | INTRAMUSCULAR | Status: DC | PRN

## 2014-06-10 MED ORDER — PROPOFOL 10 MG/ML IV BOLUS
INTRAVENOUS | Status: AC
Start: 2014-06-10 — End: 2014-06-10
  Filled 2014-06-10: qty 20

## 2014-06-10 MED ORDER — MENTHOL 3 MG MT LOZG: 1.0000 | LOZENGE | OROMUCOSAL | Status: DC | PRN

## 2014-06-10 MED ORDER — FENTANYL CITRATE 0.05 MG/ML IJ SOLN
INTRAMUSCULAR | Status: AC
  Filled 2014-06-10: qty 5

## 2014-06-10 MED ORDER — OXYCODONE HCL 5 MG PO TABS: 5.0000 mg | ORAL_TABLET | Freq: Once | ORAL | Status: DC | PRN

## 2014-06-10 MED ORDER — GABAPENTIN 300 MG PO CAPS
300.0000 mg | ORAL_CAPSULE | Freq: Two times a day (BID) | ORAL | Status: DC
  Administered 2014-06-10 – 2014-06-11 (×2): 300 mg via ORAL
  Filled 2014-06-10 (×3): qty 1

## 2014-06-10 MED ORDER — LIDOCAINE HCL (CARDIAC) 20 MG/ML IV SOLN
INTRAVENOUS | Status: AC
  Filled 2014-06-10: qty 5

## 2014-06-10 MED ORDER — DEXAMETHASONE 4 MG PO TABS
4.0000 mg | ORAL_TABLET | Freq: Four times a day (QID) | ORAL | Status: AC
  Administered 2014-06-10 – 2014-06-11 (×2): 4 mg via ORAL
  Filled 2014-06-10 (×2): qty 1

## 2014-06-10 MED ORDER — CEFAZOLIN SODIUM-DEXTROSE 2-3 GM-% IV SOLR
INTRAVENOUS | Status: AC
  Filled 2014-06-10: qty 50

## 2014-06-10 MED ORDER — PROMETHAZINE HCL 25 MG/ML IJ SOLN: 6.2500 mg | INTRAMUSCULAR | Status: DC | PRN

## 2014-06-10 MED ORDER — MIDAZOLAM HCL 5 MG/5ML IJ SOLN
INTRAMUSCULAR | Status: DC | PRN
  Administered 2014-06-10: 1 mg via INTRAVENOUS

## 2014-06-10 MED ORDER — DIAZEPAM 5 MG PO TABS: 5.0000 mg | ORAL_TABLET | Freq: Four times a day (QID) | ORAL | Status: DC | PRN

## 2014-06-10 MED ORDER — ONDANSETRON HCL 4 MG/2ML IJ SOLN: 4.0000 mg | INTRAMUSCULAR | Status: DC | PRN

## 2014-06-10 MED ORDER — MORPHINE SULFATE 2 MG/ML IJ SOLN: 1.0000 mg | INTRAMUSCULAR | Status: DC | PRN

## 2014-06-10 MED ORDER — SIMVASTATIN 20 MG PO TABS
20.0000 mg | ORAL_TABLET | Freq: Every day | ORAL | Status: DC
  Administered 2014-06-10: 20 mg via ORAL
  Filled 2014-06-10 (×2): qty 1

## 2014-06-10 MED ORDER — NEOSTIGMINE METHYLSULFATE 10 MG/10ML IV SOLN
INTRAVENOUS | Status: DC | PRN
  Administered 2014-06-10: 3 mg via INTRAVENOUS

## 2014-06-10 MED ORDER — LIDOCAINE HCL 4 % MT SOLN
OROMUCOSAL | Status: DC | PRN
  Administered 2014-06-10: 3 mL via TOPICAL

## 2014-06-10 MED ORDER — OXYCODONE HCL 5 MG/5ML PO SOLN: 5.0000 mg | Freq: Once | ORAL | Status: DC | PRN

## 2014-06-10 MED ORDER — FENTANYL CITRATE 0.05 MG/ML IJ SOLN
INTRAMUSCULAR | Status: DC | PRN
  Administered 2014-06-10 (×2): 50 ug via INTRAVENOUS
  Administered 2014-06-10: 100 ug via INTRAVENOUS

## 2014-06-10 MED ORDER — OXYCODONE-ACETAMINOPHEN 5-325 MG PO TABS
1.0000 | ORAL_TABLET | ORAL | Status: DC | PRN
  Administered 2014-06-11: 1 via ORAL
  Filled 2014-06-10: qty 1

## 2014-06-10 MED ORDER — BUPIVACAINE-EPINEPHRINE (PF) 0.5% -1:200000 IJ SOLN
INTRAMUSCULAR | Status: DC | PRN
  Administered 2014-06-10: 10 mL via PERINEURAL

## 2014-06-10 MED ORDER — SODIUM CHLORIDE 0.9 % IV SOLN
10.0000 mg | INTRAVENOUS | Status: DC | PRN
  Administered 2014-06-10: 25 ug/min via INTRAVENOUS

## 2014-06-10 MED ORDER — DEXAMETHASONE SODIUM PHOSPHATE 4 MG/ML IJ SOLN: 4.0000 mg | Freq: Four times a day (QID) | INTRAMUSCULAR | Status: AC

## 2014-06-10 MED ORDER — ROCURONIUM BROMIDE 100 MG/10ML IV SOLN
INTRAVENOUS | Status: DC | PRN
  Administered 2014-06-10: 50 mg via INTRAVENOUS

## 2014-06-10 MED ORDER — SODIUM CHLORIDE 0.9 % IV SOLN
INTRAVENOUS | Status: DC
  Administered 2014-06-10 (×2): via INTRAVENOUS

## 2014-06-10 MED ORDER — LEVOTHYROXINE SODIUM 175 MCG PO TABS
175.0000 ug | ORAL_TABLET | Freq: Every day | ORAL | Status: DC
  Administered 2014-06-11: 175 ug via ORAL
  Filled 2014-06-10 (×2): qty 1

## 2014-06-10 MED ORDER — ONDANSETRON HCL 4 MG/2ML IJ SOLN
INTRAMUSCULAR | Status: DC | PRN
  Administered 2014-06-10: 4 mg via INTRAVENOUS

## 2014-06-10 MED ORDER — ACETAMINOPHEN 650 MG RE SUPP: 650.0000 mg | RECTAL | Status: DC | PRN

## 2014-06-10 MED ORDER — PROPOFOL 10 MG/ML IV BOLUS
INTRAVENOUS | Status: DC | PRN
  Administered 2014-06-10: 160 mg via INTRAVENOUS

## 2014-06-10 SURGICAL SUPPLY — 59 items
APL SKNCLS STERI-STRIP NONHPOA (GAUZE/BANDAGES/DRESSINGS) ×1
BAG DECANTER FOR FLEXI CONT (MISCELLANEOUS) ×3 IMPLANT
BENZOIN TINCTURE PRP APPL 2/3 (GAUZE/BANDAGES/DRESSINGS) ×3 IMPLANT
BIT DRILL NEURO 2X3.1 SFT TUCH (MISCELLANEOUS) ×1 IMPLANT
BLADE 10 SAFETY STRL DISP (BLADE) ×3 IMPLANT
BLADE SURG 15 STRL LF DISP TIS (BLADE) ×1 IMPLANT
BLADE SURG 15 STRL SS (BLADE) ×3
BLADE ULTRA TIP 2M (BLADE) ×3 IMPLANT
BRUSH SCRUB EZ PLAIN DRY (MISCELLANEOUS) ×3 IMPLANT
BUR BARREL STRAIGHT FLUTE 4.0 (BURR) ×3 IMPLANT
BUR MATCHSTICK NEURO 3.0 LAGG (BURR) ×3 IMPLANT
CANISTER SUCT 3000ML (MISCELLANEOUS) ×3 IMPLANT
CLOSURE WOUND 1/2 X4 (GAUZE/BANDAGES/DRESSINGS) ×1
CONT SPEC 4OZ CLIKSEAL STRL BL (MISCELLANEOUS) ×3 IMPLANT
COVER MAYO STAND STRL (DRAPES) ×3 IMPLANT
DRAPE LAPAROTOMY 100X72 PEDS (DRAPES) ×3 IMPLANT
DRAPE MICROSCOPE LEICA (MISCELLANEOUS) IMPLANT
DRAPE POUCH INSTRU U-SHP 10X18 (DRAPES) ×3 IMPLANT
DRAPE SURG 17X23 STRL (DRAPES) ×6 IMPLANT
DRILL NEURO 2X3.1 SOFT TOUCH (MISCELLANEOUS) ×3
ELECT REM PT RETURN 9FT ADLT (ELECTROSURGICAL) ×3
ELECTRODE REM PT RTRN 9FT ADLT (ELECTROSURGICAL) ×1 IMPLANT
GAUZE SPONGE 4X4 16PLY XRAY LF (GAUZE/BANDAGES/DRESSINGS) IMPLANT
GLOVE BIO SURGEON STRL SZ8.5 (GLOVE) ×3 IMPLANT
GLOVE EXAM NITRILE LRG STRL (GLOVE) IMPLANT
GLOVE EXAM NITRILE MD LF STRL (GLOVE) IMPLANT
GLOVE EXAM NITRILE XL STR (GLOVE) IMPLANT
GLOVE EXAM NITRILE XS STR PU (GLOVE) IMPLANT
GLOVE SS BIOGEL STRL SZ 8 (GLOVE) ×1 IMPLANT
GLOVE SUPERSENSE BIOGEL SZ 8 (GLOVE) ×2
GOWN STRL REUS W/ TWL LRG LVL3 (GOWN DISPOSABLE) IMPLANT
GOWN STRL REUS W/ TWL XL LVL3 (GOWN DISPOSABLE) ×1 IMPLANT
GOWN STRL REUS W/TWL LRG LVL3 (GOWN DISPOSABLE)
GOWN STRL REUS W/TWL XL LVL3 (GOWN DISPOSABLE) ×3
KIT BASIN OR (CUSTOM PROCEDURE TRAY) ×3 IMPLANT
KIT ROOM TURNOVER OR (KITS) ×3 IMPLANT
MARKER SKIN DUAL TIP RULER LAB (MISCELLANEOUS) ×3 IMPLANT
NDL SPNL 18GX3.5 QUINCKE PK (NEEDLE) ×1 IMPLANT
NEEDLE HYPO 22GX1.5 SAFETY (NEEDLE) ×3 IMPLANT
NEEDLE SPNL 18GX3.5 QUINCKE PK (NEEDLE) ×3 IMPLANT
NS IRRIG 1000ML POUR BTL (IV SOLUTION) ×3 IMPLANT
PACK LAMINECTOMY NEURO (CUSTOM PROCEDURE TRAY) ×3 IMPLANT
PEEK VISTA 14X14X7MM (Peek) ×2 IMPLANT
PIN DISTRACTION 14MM (PIN) ×6 IMPLANT
PLATE ANT CERV XTEND 1 LV 14 (Plate) ×2 IMPLANT
PUTTY 2.5ML ACTIFUSE ABX (Putty) ×2 IMPLANT
RUBBERBAND STERILE (MISCELLANEOUS) IMPLANT
SCREW XTD VAR 4.2 SELF TAP (Screw) ×8 IMPLANT
SPONGE GAUZE 4X4 12PLY (GAUZE/BANDAGES/DRESSINGS) ×3 IMPLANT
SPONGE INTESTINAL PEANUT (DISPOSABLE) ×6 IMPLANT
SPONGE SURGIFOAM ABS GEL SZ50 (HEMOSTASIS) ×3 IMPLANT
STRIP CLOSURE SKIN 1/2X4 (GAUZE/BANDAGES/DRESSINGS) ×2 IMPLANT
SUT VIC AB 0 CT1 27 (SUTURE) ×3
SUT VIC AB 0 CT1 27XBRD ANTBC (SUTURE) ×1 IMPLANT
SUT VIC AB 3-0 SH 8-18 (SUTURE) ×3 IMPLANT
SYR 20ML ECCENTRIC (SYRINGE) ×3 IMPLANT
TOWEL OR 17X24 6PK STRL BLUE (TOWEL DISPOSABLE) ×3 IMPLANT
TOWEL OR 17X26 10 PK STRL BLUE (TOWEL DISPOSABLE) ×3 IMPLANT
WATER STERILE IRR 1000ML POUR (IV SOLUTION) ×3 IMPLANT

## 2014-06-10 NOTE — Plan of Care (Signed)
Problem: Consults Goal: Diagnosis - Spinal Surgery Outcome: Completed/Met Date Met:  06/10/14 Cervical Spine Fusion

## 2014-06-10 NOTE — Progress Notes (Signed)
Subjective:  The patient is somnolent but easily arousable. He is in no apparent distress.  Objective: Vital signs in last 24 hours: Temp:  [97.5 F (36.4 C)-97.7 F (36.5 C)] 97.5 F (36.4 C) (07/15 1451) Pulse Rate:  [51-60] 60 (07/15 1454) Resp:  [18-20] 18 (07/15 1454) BP: (139-191)/(84-92) 139/84 mmHg (07/15 1454) SpO2:  [98 %-100 %] 100 % (07/15 1454) Weight:  [88.225 kg (194 lb 8 oz)] 88.225 kg (194 lb 8 oz) (07/15 0956)  Intake/Output from previous day:   Intake/Output this shift: Total I/O In: 800 [I.V.:800] Out: 100 [Blood:100]  Physical exam the patient, but arousable. He is moving all 4 extremities well. His dressing is clean and dry. There is no evidence of hematoma or shift.  Lab Results: No results found for this basename: WBC, HGB, HCT, PLT,  in the last 72 hours BMET No results found for this basename: NA, K, CL, CO2, GLUCOSE, BUN, CREATININE, CALCIUM,  in the last 72 hours  Studies/Results: Dg Cervical Spine 2-3 Views  06/10/2014   CLINICAL DATA:  Anterior cervical spine fusion  EXAM: CERVICAL SPINE - 2-3 VIEW  COMPARISON:  MR C-spine of 03/13/2014  FINDINGS: The portable cross-table lateral view from the operating room labeled #1 shows a needle positioned anteriorly localizing the C4-5 interspace. Degenerative disc disease also is noted at C5-6.  A portable cross-table lateral view labeled #2 shows anterior fusion at C4-5 has been performed. An anterior fusion plate appears to be in good position as is the interbody fusion plug with normal alignment maintained.  IMPRESSION: Anterior fusion at C4-5.  Normal alignment.   Electronically Signed   By: Dwyane DeePaul  Barry M.D.   On: 06/10/2014 14:45    Assessment/Plan: The patient is doing well.  LOS: 0 days     Joshva Labreck D 06/10/2014, 2:59 PM

## 2014-06-10 NOTE — Anesthesia Procedure Notes (Signed)
Procedure Name: Intubation Date/Time: 06/10/2014 12:50 PM Performed by: Rush Farmer E Pre-anesthesia Checklist: Patient identified, Emergency Drugs available, Suction available, Patient being monitored and Timeout performed Patient Re-evaluated:Patient Re-evaluated prior to inductionOxygen Delivery Method: Circle system utilized Preoxygenation: Pre-oxygenation with 100% oxygen Intubation Type: IV induction Ventilation: Mask ventilation without difficulty Laryngoscope Size: Mac and 3 Grade View: Grade I Tube type: Oral Tube size: 7.5 mm Number of attempts: 1 Airway Equipment and Method: Stylet and LTA kit utilized Placement Confirmation: ETT inserted through vocal cords under direct vision,  positive ETCO2 and breath sounds checked- equal and bilateral Secured at: 22 cm Tube secured with: Tape Dental Injury: Teeth and Oropharynx as per pre-operative assessment

## 2014-06-10 NOTE — Anesthesia Preprocedure Evaluation (Addendum)
Anesthesia Evaluation  Patient identified by MRN, date of birth, ID band Patient awake    Reviewed: Allergy & Precautions, H&P , NPO status , Patient's Chart, lab work & pertinent test results  History of Anesthesia Complications Negative for: history of anesthetic complications  Airway Mallampati: I  Neck ROM: Full    Dental  (+) Teeth Intact, Caps   Pulmonary neg pulmonary ROS,  breath sounds clear to auscultation        Cardiovascular hypertension, + Peripheral Vascular Disease Rhythm:Regular Rate:Normal     Neuro/Psych    GI/Hepatic   Endo/Other  Hypothyroidism   Renal/GU      Musculoskeletal   Abdominal   Peds  Hematology   Anesthesia Other Findings   Reproductive/Obstetrics                          Anesthesia Physical Anesthesia Plan  ASA: II  Anesthesia Plan: General   Post-op Pain Management:    Induction: Intravenous  Airway Management Planned: Oral ETT  Additional Equipment:   Intra-op Plan:   Post-operative Plan: Extubation in OR  Informed Consent: I have reviewed the patients History and Physical, chart, labs and discussed the procedure including the risks, benefits and alternatives for the proposed anesthesia with the patient or authorized representative who has indicated his/her understanding and acceptance.   Dental advisory given  Plan Discussed with: CRNA and Surgeon  Anesthesia Plan Comments:         Anesthesia Quick Evaluation

## 2014-06-10 NOTE — Transfer of Care (Signed)
Immediate Anesthesia Transfer of Care Note  Patient: Juan Jennings  Procedure(s) Performed: Procedure(s): ANTERIOR CERVICAL DECOMPRESSION/DISCECTOMY FUSION 1 LEVEL Cervical four/five anterior cervical decompression with fusion interbody prosthesis plating and bonegraft (N/A)  Patient Location: PACU  Anesthesia Type:General  Level of Consciousness: awake, alert  and oriented  Airway & Oxygen Therapy: Patient Spontanous Breathing and Patient connected to face mask oxygen  Post-op Assessment: Report given to PACU RN, Post -op Vital signs reviewed and stable and Patient moving all extremities X 4  Post vital signs: Reviewed and stable  Complications: No apparent anesthesia complications

## 2014-06-10 NOTE — H&P (Signed)
Subjective: The patient is a 68 year old white male who has presented with a cervical myelopathy. He was worked up with a cervical MRI which demonstrated spinal stenosis and spinal cord signal change at C4-5. I discussed the various treatment options with the patient including surgery. He has weighed the risks, benefits, and alternatives surgery and decided proceed with a C4-5 anterior cervical discectomy, fusion, and plating.   Past Medical History  Diagnosis Date  . Hypertension   . Gout   . Thyroid disease   . Hyperlipidemia   . Heart murmur     with unremarkable echo 2012  . Cataract   . Hypothyroidism   . CKD (chronic kidney disease)     followed by Dr. Briant CedarMattingly with renal  . Arthritis     Past Surgical History  Procedure Laterality Date  . [place] renal/aorta  4/97    Left cyst  . Cystoscopy  3/85    IVP - Microhematuria, prost inflammation, (-)  . Right renal artery us (-),  abd us(-)  05/03/98    Left renal stenosis  . Hospital  02/11/00    Syncope  . Stress cardiolite  3/01    Normal  . Left cataract with iol Bilateral 10/06    Allergies  Allergen Reactions  . Allopurinol Other (See Comments)    Unknown   . Cymbalta [Duloxetine Hcl] Other (See Comments)    Itching and stinging at night  . Hydrochlorothiazide     REACTION: "body on fire"question if this is right    History  Substance Use Topics  . Smoking status: Never Smoker   . Smokeless tobacco: Not on file  . Alcohol Use: No    Family History  Problem Relation Age of Onset  . Diabetes Mother   . Kidney disease Mother     Renal disease, dialysis  . Kidney disease Sister     renal disease, dialysis  . Prostate cancer Paternal Uncle   . Colon cancer Neg Hx    Prior to Admission medications   Medication Sig Start Date End Date Taking? Authorizing Provider  azelastine (ASTELIN) 0.1 % nasal spray Place 2 sprays into both nostrils 2 (two) times daily as needed for rhinitis or allergies. Use in each nostril as  directed   Yes Historical Provider, MD  colchicine 0.6 MG tablet Take 0.6 mg by mouth daily.   Yes Historical Provider, MD  diclofenac (VOLTAREN) 50 MG EC tablet Take 50 mg by mouth daily. 02/18/13  Yes Joaquim NamGraham S Duncan, MD  docusate sodium (COLACE) 100 MG capsule Take 100 mg by mouth 2 (two) times daily.   Yes Historical Provider, MD  gabapentin (NEURONTIN) 300 MG capsule Take 300 mg by mouth 2 (two) times daily.   Yes Historical Provider, MD  irbesartan (AVAPRO) 300 MG tablet Take 300 mg by mouth daily.   Yes Historical Provider, MD  levothyroxine (SYNTHROID, LEVOTHROID) 175 MCG tablet Take 175 mcg by mouth daily before breakfast.   Yes Historical Provider, MD  polyethylene glycol (MIRALAX / GLYCOLAX) packet Take 17 g by mouth daily.   Yes Historical Provider, MD  pravastatin (PRAVACHOL) 40 MG tablet Take 40 mg by mouth daily.   Yes Historical Provider, MD     Review of Systems  Positive ROS: As above  All other systems have been reviewed and were otherwise negative with the exception of those mentioned in the HPI and as above.  Objective: Vital signs in last 24 hours: Temp:  [97.7 F (36.5 C)] 97.7  F (36.5 C) (07/15 0956) Pulse Rate:  [51] 51 (07/15 0956) Resp:  [20] 20 (07/15 0956) BP: (191)/(92) 191/92 mmHg (07/15 1015) SpO2:  [98 %] 98 % (07/15 0956) Weight:  [88.225 kg (194 lb 8 oz)] 88.225 kg (194 lb 8 oz) (07/15 0956)  General Appearance: Alert, cooperative, no distress, Head: Normocephalic, without obvious abnormality, atraumatic Eyes: PERRL, conjunctiva/corneas clear, EOM's intact,    Ears: Normal  Throat: Normal  Neck: Supple, symmetrical, trachea midline, no adenopathy; thyroid: No enlargement/tenderness/nodules; no carotid bruit or JVD Back: Symmetric, no curvature, ROM normal, no CVA tenderness Lungs: Clear to auscultation bilaterally, respirations unlabored Heart: Regular rate and rhythm, no murmur, rub or gallop Abdomen: Soft, non-tender,, no masses, no  organomegaly Extremities: Extremities normal, atraumatic, no cyanosis or edema Pulses: 2+ and symmetric all extremities Skin: Skin color, texture, turgor normal, no rashes or lesions  NEUROLOGIC:   Mental status: alert and oriented, no aphasia, good attention span, Fund of knowledge/ memory ok Motor Exam - grossly normal the patient has weakness in his bilateral hands. Sensory Exam - grossly normal except he has numbness in his bilateral hands. Reflexes: Hyperreflexia Coordination - grossly normal Gait - grossly normal Balance - grossly normal Cranial Nerves: I: smell Not tested  II: visual acuity  OS: Normal  OD: Normal   II: visual fields Full to confrontation  II: pupils Equal, round, reactive to light  III,VII: ptosis None  III,IV,VI: extraocular muscles  Full ROM  V: mastication Normal  V: facial light touch sensation  Normal  V,VII: corneal reflex  Present  VII: facial muscle function - upper  Normal  VII: facial muscle function - lower Normal  VIII: hearing Not tested  IX: soft palate elevation  Normal  IX,X: gag reflex Present  XI: trapezius strength  5/5  XI: sternocleidomastoid strength 5/5  XI: neck flexion strength  5/5  XII: tongue strength  Normal    Data Review Lab Results  Component Value Date   WBC 7.4 06/01/2014   HGB 13.7 06/01/2014   HCT 39.4 06/01/2014   MCV 91.0 06/01/2014   PLT 167 06/01/2014   Lab Results  Component Value Date   NA 143 06/01/2014   K 4.8 06/01/2014   CL 106 06/01/2014   CO2 24 06/01/2014   BUN 22 06/01/2014   CREATININE 1.53* 06/01/2014   GLUCOSE 92 06/01/2014   Lab Results  Component Value Date   INR 1.0 ratio 09/21/2009    Assessment/Plan: 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 I reviewed his imaging studies with them and pointed out the abnormalities. We have discussed the various treatment options including surgery. I have described the surgical  treatment option of of a C4-5 anterior cervical discectomy, fusion, and plating. I've shown him surgical models. We have discussed the risks, benefits, alternatives, and likelihood of achieving our goals with surgery. I have answered all the patient's questions. He has decided to proceed with surgery.   Jovani Flury D 06/10/2014 12:11 PM

## 2014-06-10 NOTE — Op Note (Signed)
Brief history: The patient is a 68 year old white male who has complained of symptoms consistent with a cervical myelopathy. He was worked up with a cervical MRI which demonstrated multilevel degenerative changes with severe stenosis and spinal cord signal change at C4-5. I discussed the various treatment options with the patient including surgery. He has weighed the risks, benefits, and alternatives surgery and decided proceed with a C4-5 anterior cervical discectomy, fusion, and plating.  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 Delma OfficerJeff Kymberly Blomberg  Asst.: Dr. Jillyn HiddenGary cram  Anesthesia: Gen. endotracheal  Estimated blood loss: Minimal  Drains: None  Complications: None  Description of procedure: The patient was brought to the operating room by the anesthesia team. General endotracheal anesthesia was induced. A roll was placed under the patient's shoulders to keep the neck in the neutral position. The patient's anterior cervical region was then prepared with Betadine scrub and Betadine solution. Sterile drapes were applied.  The area to be incised was then injected with Marcaine with epinephrine solution. I then used a scalpel to make a transverse incision in the patient's left anterior neck. I used the Metzenbaum scissors to divide the platysmal muscle and then to dissect medial to the sternocleidomastoid muscle, jugular vein, and carotid artery. I carefully dissected down towards the anterior cervical spine identifying the esophagus and retracting it medially. Then using Kitner swabs to clear soft  tissue from the anterior cervical spine. We then inserted a bent spinal needle into the upper exposed intervertebral disc space. We then obtained intraoperative radiographs confirm our location.  I then used electrocautery to detach the medial border of the longus colli muscle bilaterally from the C4-5 intervertebral disc spaces. I then inserted the Caspar self-retaining retractor underneath the longus colli muscle bilaterally to provide exposure.  We then incised the intervertebral disc at C4-5. We then performed a partial intervertebral discectomy with a pituitary forceps and the Karlin curettes. I then inserted distraction screws into the vertebral bodies at C4-5. We then distracted the interspace. We then used the high-speed drill to decorticate the vertebral endplates at C4-5, to drill away the remainder of the intervertebral disc, to drill away some posterior spondylosis, and to thin out the posterior longitudinal ligament. I then incised ligament with the arachnoid knife. We then removed the ligament with a Kerrison punches undercutting the vertebral endplates and decompressing the thecal sac. We then performed foraminotomies about the bilateral C5 nerve roots. This completed the decompression at this level.   We now turned our to attention to the interbody fusion. We used the trial spacers to determine the appropriate size for the interbody prosthesis. We then pre-filled prosthesis with a combination of local morcellized autograft bone that we obtained during decompression as well as Actifuse bone graft extender. We then inserted the prosthesis into the distracted interspace at C4-5. We then removed the distraction screws. There was a good snug fit of the prosthesis in the interspace.   Having completed the fusion we now turned attention to the anterior spinal instrumentation. We used the high-speed drill to drill away some anterior spondylosis at the disc spaces so that the plate lay down flat. We  selected the appropriate length titanium anterior cervical plate. We laid it  along the anterior aspect of the vertebral bodies from C4-5. We then drilled 14 mm holes at C4 and C5. We then secured the plate to the vertebral bodies by placing two 14 mm self-tapping screws at C4 and C5. We then obtained intraoperative radiograph. The demonstrating good position of the instrumentation. We therefore secured the screws the plate the locking each cam. This completed the instrumentation.  We then obtained hemostasis using bipolar electrocautery. We irrigated the wound out with bacitracin solution. We then removed the retractor. We inspected the esophagus for any damage. There was none apparent. We then reapproximated patient's platysmal muscle with interrupted 3-0 Vicryl suture. We then reapproximated the subcutaneous tissue with interrupted 3-0 Vicryl suture. The skin was reapproximated with Steri-Strips and benzoin. The wound was then covered with bacitracin ointment. A sterile dressing was applied. The drapes were removed. Patient was subsequently extubated by the anesthesia team and transported to the post anesthesia care unit in stable condition. All sponge instrument and needle counts were reportedly correct at the end of this case.

## 2014-06-10 NOTE — Anesthesia Postprocedure Evaluation (Signed)
  Anesthesia Post-op Note  Patient: Juan Jennings  Procedure(s) Performed: Procedure(s): ANTERIOR CERVICAL DECOMPRESSION/DISCECTOMY FUSION 1 LEVEL Cervical four/five anterior cervical decompression with fusion interbody prosthesis plating and bonegraft (N/A)  Patient Location: PACU  Anesthesia Type:General  Level of Consciousness: awake and alert   Airway and Oxygen Therapy: Patient Spontanous Breathing  Post-op Pain: mild  Post-op Assessment: Post-op Vital signs reviewed and Patient's Cardiovascular Status Stable  Post-op Vital Signs: stable  Last Vitals:  Filed Vitals:   06/10/14 1603  BP:   Pulse: 55  Temp: 36 C  Resp: 18    Complications: No apparent anesthesia complications

## 2014-06-11 DIAGNOSIS — M4712 Other spondylosis with myelopathy, cervical region: Secondary | ICD-10-CM | POA: Diagnosis not present

## 2014-06-11 MED ORDER — OXYCODONE-ACETAMINOPHEN 10-325 MG PO TABS
1.0000 | ORAL_TABLET | ORAL | Status: DC | PRN
Start: 2014-06-11 — End: 2017-02-18

## 2014-06-11 MED ORDER — DIAZEPAM 5 MG PO TABS
5.0000 mg | ORAL_TABLET | Freq: Four times a day (QID) | ORAL | Status: DC | PRN
Start: 2014-06-11 — End: 2017-02-18

## 2014-06-11 MED ORDER — TAMSULOSIN HCL 0.4 MG PO CAPS
0.4000 mg | ORAL_CAPSULE | Freq: Every day | ORAL | Status: DC
  Administered 2014-06-11: 0.4 mg via ORAL
  Filled 2014-06-11: qty 1

## 2014-06-11 MED ORDER — TAMSULOSIN HCL 0.4 MG PO CAPS: 0.4000 mg | ORAL_CAPSULE | Freq: Every day | ORAL | Status: DC

## 2014-06-11 NOTE — Discharge Summary (Signed)
Physician Discharge Summary  Patient ID: Juan Jennings MRN: [id number] DOB/AGE: May 09, 1946 68 y.o.  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  The patient's postoperative course was only remarkable for urinary retention. He was started on Flomax. This progressively improved throughout the day. The patient requested discharge to home. The patient, his wife, were given oral and written discharge instructions. He was instructed to followup with primary medical doctor regarding his urinary retention. All their questions were answered.  Consults: None Significant Diagnostic Studies: None Treatments: C4-5 anterior cervical discectomy, fusion, plating Discharge Exam: Blood pressure 141/90, pulse 83, temperature 98.7 F (37.1 C), temperature source Oral, resp. rate 18, height 5\' 8"  (1.727 m), weight 88.225 kg (194 lb 8 oz), SpO2 94.00%. The patient is alert and pleasant. His strength is normal. His dressing is clean and dry. There is no evidence of hematoma or shift.  Disposition: Home  Discharge Instructions   Call MD for:  difficulty breathing, headache or visual disturbances    Complete by:  As directed      Call MD for:  extreme fatigue    Complete by:  As directed      Call MD for:  hives    Complete by:  As directed      Call MD for:  persistant dizziness or light-headedness    Complete by:  As directed      Call MD for:  persistant nausea and vomiting    Complete by:  As directed      Call MD for:  redness, tenderness, or signs of infection (pain, swelling, redness, odor or green/yellow discharge around incision site)    Complete by:  As  directed      Call MD for:  severe uncontrolled pain    Complete by:  As directed      Call MD for:  temperature >100.4    Complete by:  As directed      Diet - low sodium heart healthy    Complete by:  As directed      Discharge instructions    Complete by:  As directed   Call [phone] for a followup appointment. Take a stool softener while you are using pain medications.     Driving Restrictions    Complete by:  As directed   Do not drive for 2 weeks.     Increase activity slowly    Complete by:  As directed      Lifting restrictions    Complete by:  As directed   Do not lift more than 5 pounds. No excessive bending or twisting.     May shower / Bathe    Complete by:  As directed   He may shower after the pain she is removed 3 days after surgery. Leave the incision alone.     Remove dressing in 48 hours    Complete by:  As directed   Your stitches are under the scan and will dissolve by themselves. The Steri-Strips will fall off after you take a few showers. Do not rub back or pick at the wound, Leave the wound alone.            Medication List    STOP taking these  medications       diclofenac 50 MG EC tablet  Commonly known as:  VOLTAREN      TAKE these medications       azelastine 0.1 % nasal spray  Commonly known as:  ASTELIN  Place 2 sprays into both nostrils 2 (two) times daily as needed for rhinitis or allergies. Use in each nostril as directed     colchicine 0.6 MG tablet  Take 0.6 mg by mouth daily.     diazepam 5 MG tablet  Commonly known as:  VALIUM  Take 1 tablet (5 mg total) by mouth every 6 (six) hours as needed for muscle spasms.     docusate sodium 100 MG capsule  Commonly known as:  COLACE  Take 100 mg by mouth 2 (two) times daily.     gabapentin 300 MG capsule  Commonly known as:  NEURONTIN  Take 300 mg by mouth 2 (two) times daily.     irbesartan 300 MG tablet  Commonly known as:  AVAPRO  Take 300 mg by mouth daily.      levothyroxine 175 MCG tablet  Commonly known as:  SYNTHROID, LEVOTHROID  Take 175 mcg by mouth daily before breakfast.     polyethylene glycol packet  Commonly known as:  MIRALAX / GLYCOLAX  Take 17 g by mouth daily.     pravastatin 40 MG tablet  Commonly known as:  PRAVACHOL  Take 40 mg by mouth daily.     tamsulosin 0.4 MG Caps capsule  Commonly known as:  FLOMAX  Take 1 capsule (0.4 mg total) by mouth daily.         SignedCristi Loron: Evangelina Delancey D 06/11/2014, 5:55 PM

## 2014-06-11 NOTE — Progress Notes (Signed)
Patient ID: Juan Jennings, male   DOB: Jan 30, 1946, 68 y.o.   MRN: [id number] Subjective:  The patient is alert and pleasant. He looks well. He wants to go home. He has had some urinary retention has been catheterized twice.  Objective: Vital signs in last 24 hours: Temp:  [96.8 F (36 C)-99.3 F (37.4 C)] 97.9 F (36.6 C) (07/16 0820) Pulse Rate:  [50-86] 62 (07/16 0820) Resp:  [16-27] 20 (07/16 0820) BP: (131-169)/(69-90) 152/90 mmHg (07/16 0820) SpO2:  [94 %-100 %] 94 % (07/16 0820)  Intake/Output from previous day: 07/15 0701 - 07/16 0700 In: 800 [I.V.:800] Out: 3017 [Urine:2917; Blood:100] Intake/Output this shift: Total I/O In: 240 [P.O.:240] Out: -   Physical exam patient is alert and oriented. His strength is normal. His dressing is clean and dry.  Lab Results: No results found for this basename: WBC, HGB, HCT, PLT,  in the last 72 hours BMET No results found for this basename: NA, K, CL, CO2, GLUCOSE, BUN, CREATININE, CALCIUM,  in the last 72 hours  Studies/Results: Dg Cervical Spine 2-3 Views  06/10/2014   CLINICAL DATA:  Anterior cervical spine fusion  EXAM: CERVICAL SPINE - 2-3 VIEW  COMPARISON:  MR C-spine of 03/13/2014  FINDINGS: The portable cross-table lateral view from the operating room labeled #1 shows a needle positioned anteriorly localizing the C4-5 interspace. Degenerative disc disease also is noted at C5-6.  A portable cross-table lateral view labeled #2 shows anterior fusion at C4-5 has been performed. An anterior fusion plate appears to be in good position as is the interbody fusion plug with normal alignment maintained.  IMPRESSION: Anterior fusion at C4-5.  Normal alignment.   Electronically Signed   By: Dwyane DeePaul  Barry M.D.   On: 06/10/2014 14:45    Assessment/Plan: Postop day 1: The patient is doing well except for urinary retention. I'll add Flomax. We'll send him home after this resolves.  LOS: 1 day     Nabila Albarracin D 06/11/2014, 11:59  AM

## 2014-06-12 ENCOUNTER — Encounter (HOSPITAL_COMMUNITY): Payer: Self-pay | Admitting: Neurosurgery

## 2014-06-21 ENCOUNTER — Emergency Department (HOSPITAL_COMMUNITY): Payer: Medicare Other

## 2014-06-21 ENCOUNTER — Encounter (HOSPITAL_COMMUNITY): Payer: Self-pay | Admitting: Emergency Medicine

## 2014-06-21 ENCOUNTER — Emergency Department (HOSPITAL_COMMUNITY)
Admission: EM | Admit: 2014-06-21 | Discharge: 2014-06-21 | Disposition: A | Discharge status: Home / Self Care | Payer: Medicare Other | Attending: Emergency Medicine | Admitting: Emergency Medicine

## 2014-06-21 DIAGNOSIS — J8410 Pulmonary fibrosis, unspecified: Secondary | ICD-10-CM | POA: Insufficient documentation

## 2014-06-21 DIAGNOSIS — Y838 Other surgical procedures as the cause of abnormal reaction of the patient, or of later complication, without mention of misadventure at the time of the procedure: Secondary | ICD-10-CM | POA: Diagnosis not present

## 2014-06-21 DIAGNOSIS — H9190 Unspecified hearing loss, unspecified ear: Secondary | ICD-10-CM | POA: Diagnosis not present

## 2014-06-21 DIAGNOSIS — R809 Proteinuria, unspecified: Secondary | ICD-10-CM | POA: Diagnosis not present

## 2014-06-21 DIAGNOSIS — I672 Cerebral atherosclerosis: Secondary | ICD-10-CM | POA: Diagnosis not present

## 2014-06-21 DIAGNOSIS — R630 Anorexia: Secondary | ICD-10-CM | POA: Diagnosis not present

## 2014-06-21 DIAGNOSIS — T819XXA Unspecified complication of procedure, initial encounter: Secondary | ICD-10-CM | POA: Diagnosis not present

## 2014-06-21 DIAGNOSIS — M6281 Muscle weakness (generalized): Secondary | ICD-10-CM | POA: Insufficient documentation

## 2014-06-21 DIAGNOSIS — Z981 Arthrodesis status: Secondary | ICD-10-CM | POA: Insufficient documentation

## 2014-06-21 DIAGNOSIS — R319 Hematuria, unspecified: Secondary | ICD-10-CM | POA: Insufficient documentation

## 2014-06-21 DIAGNOSIS — R531 Weakness: Secondary | ICD-10-CM

## 2014-06-21 HISTORY — DX: Gout, unspecified: M10.9

## 2014-06-21 LAB — CBC WITH DIFFERENTIAL/PLATELET
Basophils Absolute: 0 10*3/uL (ref 0.0–0.1)
Basophils Relative: 0 % (ref 0–1)
EOS ABS: 0.1 10*3/uL (ref 0.0–0.7)
EOS PCT: 1 % (ref 0–5)
HEMATOCRIT: 40.6 % (ref 39.0–52.0)
HEMOGLOBIN: 14.1 g/dL (ref 13.0–17.0)
LYMPHS ABS: 1.3 10*3/uL (ref 0.7–4.0)
Lymphocytes Relative: 9 % — ABNORMAL LOW (ref 12–46)
MCH: 31.7 pg (ref 26.0–34.0)
MCHC: 34.7 g/dL (ref 30.0–36.0)
MCV: 91.2 fL (ref 78.0–100.0)
MONOS PCT: 17 % — AB (ref 3–12)
Monocytes Absolute: 2.4 10*3/uL — ABNORMAL HIGH (ref 0.1–1.0)
Neutro Abs: 10.9 10*3/uL — ABNORMAL HIGH (ref 1.7–7.7)
Neutrophils Relative %: 73 % (ref 43–77)
Platelets: 162 10*3/uL (ref 150–400)
RBC: 4.45 MIL/uL (ref 4.22–5.81)
RDW: 12.6 % (ref 11.5–15.5)
WBC: 14.7 10*3/uL — ABNORMAL HIGH (ref 4.0–10.5)

## 2014-06-21 LAB — BASIC METABOLIC PANEL
Anion gap: 14 (ref 5–15)
BUN: 25 mg/dL — ABNORMAL HIGH (ref 6–23)
CALCIUM: 9.4 mg/dL (ref 8.4–10.5)
CO2: 26 meq/L (ref 19–32)
Chloride: 98 mEq/L (ref 96–112)
Creatinine, Ser: 1.56 mg/dL — ABNORMAL HIGH (ref 0.50–1.35)
GFR calc Af Amer: 52 mL/min — ABNORMAL LOW (ref >90)
GFR calc non Af Amer: 45 mL/min — ABNORMAL LOW (ref >90)
GLUCOSE: 101 mg/dL — AB (ref 70–99)
Potassium: 4.2 mEq/L (ref 3.7–5.3)
Sodium: 138 mEq/L (ref 137–147)

## 2014-06-21 LAB — I-STAT TROPONIN, ED
Troponin i, poc: 0 ng/mL (ref 0.00–0.08)
Troponin i, poc: 0.01 ng/mL (ref 0.00–0.08)

## 2014-06-21 LAB — URINALYSIS, ROUTINE W REFLEX MICROSCOPIC
Bilirubin Urine: NEGATIVE
Glucose, UA: NEGATIVE mg/dL
Ketones, ur: NEGATIVE mg/dL
LEUKOCYTES UA: NEGATIVE
NITRITE: NEGATIVE
PH: 6 (ref 5.0–8.0)
Protein, ur: 100 mg/dL — AB
SPECIFIC GRAVITY, URINE: 1.02 (ref 1.005–1.030)
Urobilinogen, UA: 0.2 mg/dL (ref 0.0–1.0)

## 2014-06-21 LAB — URINE MICROSCOPIC-ADD ON

## 2014-06-21 LAB — I-STAT CG4 LACTIC ACID, ED: Lactic Acid, Venous: 1.27 mmol/L (ref 0.5–2.2)

## 2014-06-21 MED ORDER — IOHEXOL 300 MG/ML  SOLN
75.0000 mL | Freq: Once | INTRAMUSCULAR | Status: AC | PRN
  Administered 2014-06-21: 75 mL via INTRAVENOUS

## 2014-06-21 MED ORDER — CEPHALEXIN 500 MG PO CAPS: 500.0000 mg | ORAL_CAPSULE | Freq: Three times a day (TID) | ORAL | Status: DC

## 2014-06-21 MED ORDER — SODIUM CHLORIDE 0.9 % IV BOLUS (SEPSIS)
500.0000 mL | Freq: Once | INTRAVENOUS | Status: AC
  Administered 2014-06-21: 500 mL via INTRAVENOUS

## 2014-06-21 MED ORDER — DEXTROSE 5 % IV SOLN
1.0000 g | Freq: Once | INTRAVENOUS | Status: AC
  Administered 2014-06-21: 1 g via INTRAVENOUS
  Filled 2014-06-21: qty 10

## 2014-06-21 NOTE — ED Provider Notes (Signed)
CSN: [id number]     Arrival date & time 06/21/14  1154 History   First MD Initiated Contact with Patient 06/21/14 1155     Chief Complaint  Patient presents with  . Post-op Problem     (Consider location/radiation/quality/duration/timing/severity/associated sxs/prior Treatment) The history is provided by the patient and medical records.   This is a 68 year old male presenting to the ED for generalized weakness. Patient had cervical disc fusion on 06/10/2014 by Dr. Lovell SheehanJenkins. Procedure well well without known complications. States he was discharged from the hospital a few days later, was able to name the way and care for himself independently. Patient s over the past few days he has progressively become generally weak with poor PO intake.  Pt unsure of fever but states he was very sweaty this morning upon waking.  Denies chills.  States his neck has been healing well and causing him minimal pain. He states he has only taken a handful of his prescription of oxycodone. He is wearing his Aspen collar as directed.  He denies any unilateral numbness, weakness, or paresthesias.  He denies any loss of bowel or bladder control.  Denies chest pain, SOB, nausea, vomiting, diarrhea, abdominal pain, cough, congestion, sore throat, or trouble swallowing.  On arrival, patient is alert and oriented, answering questions and following commands appropriately.  No past medical history on file. No past surgical history on file. No family history on file. History  Substance Use Topics  . Smoking status: Not on file  . Smokeless tobacco: Not on file  . Alcohol Use: Not on file    Review of Systems  Constitutional: Positive for appetite change.  Neurological: Positive for weakness.  All other systems reviewed and are negative.     Allergies  Review of patient's allergies indicates not on file.  Home Medications   Prior to Admission medications   Not on File   BP 113/67  Pulse 87  Temp(Src) 98.5 F  (36.9 C) (Oral)  Resp 16  SpO2 99%  Physical Exam  Nursing note and vitals reviewed. Constitutional: He is oriented to person, place, and time. He appears well-developed and well-nourished. No distress.  Elderly, hard of hearing  HENT:  Head: Normocephalic and atraumatic.  Mouth/Throat: Oropharynx is clear and moist.  Eyes: Conjunctivae and EOM are normal. Pupils are equal, round, and reactive to light.  Neck:  Aspen collar in place; collar was removed and examined neck with RN maintaining head stability-- anterior incision well healed without erythema, induration, warmth to touch, drainage, or signs of abscess formation; ROM not tested; trachea midline; normal speech; handling secretions well  Cardiovascular: Normal rate, regular rhythm and normal heart sounds.   Pulmonary/Chest: Effort normal and breath sounds normal. No respiratory distress. He has no decreased breath sounds. He has no wheezes.  Abdominal: Soft. Bowel sounds are normal. There is no tenderness. There is no rigidity, no guarding and no CVA tenderness.  Musculoskeletal: Normal range of motion.  Neurological: He is alert and oriented to person, place, and time.  AAOx3, answering questions appropriately; equal strength UE and LE bilaterally; CN grossly intact; moves all extremities appropriately without ataxia; no focal neuro deficits or facial asymmetry appreciated  Skin: Skin is warm and dry. He is not diaphoretic.  Psychiatric: He has a normal mood and affect.    ED Course  Procedures (including critical care time) Labs Review Labs Reviewed  CBC WITH DIFFERENTIAL - Abnormal; Notable for the following:    WBC 14.7 (*)  Neutro Abs 10.9 (*)    Lymphocytes Relative 9 (*)    Monocytes Relative 17 (*)    Monocytes Absolute 2.4 (*)    All other components within normal limits  BASIC METABOLIC PANEL - Abnormal; Notable for the following:    Glucose, Bld 101 (*)    BUN 25 (*)    Creatinine, Ser 1.56 (*)    GFR calc  non Af Amer 45 (*)    GFR calc Af Amer 52 (*)    All other components within normal limits  URINALYSIS, ROUTINE W REFLEX MICROSCOPIC  I-STAT CG4 LACTIC ACID, ED  Rosezena Sensor, ED    Imaging Review Dg Chest 2 View  06/21/2014   CLINICAL DATA:  Right-sided weakness; hypertension  EXAM: CHEST  2 VIEW  COMPARISON:  None.  FINDINGS: There is a 2.3 x 1.6 cm nodular opacity overlying the right heart on the frontal view. Lungs elsewhere are clear. Heart size and pulmonary vascularity are normal. No adenopathy. No bone lesions. There is mild degenerative change in the thoracic spine.  IMPRESSION: 2.3 x 1.6 cm nodular opacity on the right medially, seen only on the frontal view. This finding warrants noncontrast enhanced chest CT to further assess. Elsewhere lungs are clear.   Electronically Signed   By: Bretta Bang M.D.   On: 06/21/2014 12:58   Ct Head Wo Contrast  06/21/2014   CLINICAL DATA:  weakness  EXAM: CT HEAD WITHOUT CONTRAST  TECHNIQUE: Contiguous axial images were obtained from the base of the skull through the vertex without intravenous contrast.  COMPARISON:  None.  FINDINGS: Atherosclerotic and physiologic intracranial calcifications. There is no evidence of acute intracranial hemorrhage, brain edema, mass lesion, acute infarction, mass effect, or midline shift. Acute infarct may be inapparent on noncontrast CT. No other intra-axial abnormalities are seen, and the ventricles and sulci are within normal limits in size and symmetry. No abnormal extra-axial fluid collections or masses are identified. No significant calvarial abnormality.  IMPRESSION: Negative for bleed or other acute intracranial process.   Electronically Signed   By: Oley Balm M.D.   On: 06/21/2014 15:59   Ct Soft Tissue Neck W Contrast  06/21/2014   CLINICAL DATA:  Cervical disc fusion on 07/15, increased difficulty walking with generalized weakness decreased p.o. intake, chills  EXAM: CT NECK WITH CONTRAST   TECHNIQUE: Multidetector CT imaging of the neck was performed using the standard protocol following the bolus administration of intravenous contrast.  CONTRAST:  75mL OMNIPAQUE IOHEXOL 300 MG/ML  SOLN  COMPARISON:  None.  FINDINGS: Normal alignment status post C4-5 discectomy and anterior fusion. Mild C3-4 degenerative disc disease. 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 There is beam attenuation artifact from the compression plate which obscures the prevertebral soft tissues at this level somewhat. Allowing for this, there is no appreciated paravertebral soft tissue thickening or abscess. Epiglottis is normal. Vocal cords are normal. Thyroid is normal. No evidence of airway narrowing. Major salivary glands are normal. Small submandibular and carotid chain lymph nodes likely reactive. Lung apices clear.  IMPRESSION: No acute abnormalities. MRI would have increase sensitivity for the possibility of mild inflammation or infection in the paravertebral region particularly given beam attenuation artifact from postoperative change.   Electronically Signed   By: Esperanza Heir M.D.   On: 06/21/2014 16:12   Ct Chest Wo Contrast  06/21/2014   CLINICAL DATA:  Status post cervical disc fusion on July 15th, with increased difficulty walking and generalized weakness, chills  EXAM: CT CHEST WITHOUT CONTRAST  TECHNIQUE: Multidetector CT imaging of the chest was performed following the standard protocol without IV contrast.  COMPARISON:  Chest radiograph today  FINDINGS: The left lung is clear except for mild dependent atelectasis. On the right, there is a 6 mm calcified granuloma laterally in the apex. There is mild dependent atelectasis on the right as well. There are no other pulmonary parenchymal abnormalities. The nodular density seen on chest radiograph appears to represent normal perihilar vasculature.  Thoracic inlet is normal. Small nonpathologic mediastinal lymph nodes. Minimal aortic calcification.  Minimal coronary arterial calcification.  No pleural or pericardial effusion.  Scans the upper abdomen only partially visualized the kidneys but appear to demonstrate several partially visualized renal cysts.  There are no acute musculoskeletal findings.  IMPRESSION: No significant pulmonary parenchymal opacity or mass.   Electronically Signed   By: Esperanza Heir M.D.   On: 06/21/2014 15:56     EKG Interpretation   Date/Time:  Sunday June 21 2014 12:01:09 EDT Ventricular Rate:  85 PR Interval:  164 QRS Duration: 85 QT Interval:  358 QTC Calculation: 426 R Axis:   -10 Text Interpretation:  Sinus rhythm Inferior infarct, old Consider anterior  infarct No previous tracing Confirmed by BEATON  MD, ROBERT ([id number]) on  06/21/2014 1:08:38 PM       MDM   Final diagnoses:  Generalized weakness   68 y.o. M with generalized weakness s/p cervical fusion on 06/10/14 by Dr. Lovell Sheehan.  Has been progressively weak over the past 5 days and poor PO intake.  Denies fever, chills, but did wake up this morning in pool of sweat.  Denies neck pain, headache, dizziness, lightheadedness, confusion, changes in speech, unilateral weakness, numbness, or paresthesias.  On exam, pt AAOx4, afebrile, and non-toxic.  Neurologic exam is non-focal.  Anterior incision appears clean without signs of cellulitis or abscess formation.  Will obtain EKG, labs, u/a, CXR, CT head w/o contrast and CT neck with contrast to evaluate for deep infection.  Small fluid bolus given.  Lab work with leukocytosis of 14.7-- possible acute phase rxn from recent surgery vs infection, lactic acid WNL.  BUN/SrCr somewhat elevated, no other values available for comparison.  CT chest with findings consistent with granuloma.  CT head and soft tissue negative for acute findings or signs of infection/abscess.  U/a with large blood and WBC's noted, sent for culture with questionable UTI.    Pt given IV fluids, 1g Rocephin IV.  Pt was able to get out of bed  and ambulate in hallway with assistance of nursing staff.  No syncope or dizziness reported.  He has eaten while in the ED without difficulty. He continues to deny neck pain or headache. At this time no medical necessity for admission has been found.  Will discharge home with keflex for possible UTI pending urine culture.  Pt  instructed to FU with PCP and neurosurgery this week regarding ED visit.  Encouraged fluids at home.  Discussed plan with patient, he/she acknowledged understanding and agreed with plan of care.  Return precautions given for new or worsening symptoms.  At time of discharge pt had a documented temp of 100.102F, remainder of VS stable and he does not appear toxic-- will still allow discharge per Dr. Radford Pax.  Patient is being discharged on abx and will FU with PCP and neurosurgery this week.  Discussed case with attending physician, Dr. Radford Pax, who personally evaluated patient and agrees with assessment and plan of care.  Garlon Hatchet, PA-C 06/21/14 2024

## 2014-06-21 NOTE — Discharge Instructions (Signed)
Take the prescribed medication as directed. You will be notified in 24-48 hours if culture results are positive or antibiotic need to be changed. Follow-up with your primary care physician and/or Dr. Lovell SheehanJenkins. Return to the ED for new or worsening symptoms.

## 2014-06-21 NOTE — ED Notes (Signed)
Lactic Acid 1.27 mmol/L

## 2014-06-21 NOTE — ED Notes (Signed)
Pt reports to the ED for eval of post op problems. Pt had a cervical disc fusion on 7/15 performed by Dr. Lovell SheehanJenkins. Pt was admitted to the hospital an d upon d/c he was ambulatory without difficulty. Per [facility] ever since he was d/c a week and a half ago he has had a steady decline and has had increased difficulty with walking, generalized weakness, and decreased PO intake. Pt is unsure if he has had fevers but reports he will have chills and when he awoke this am his bed was soaked. Pt has Aspen collar in place. Denies bowel or bladder incontience, paralysis, CP, or SOB. He reports right leg numbness and tingling x 2 years. ED PA and RN maintained C-spine and examined surgical site which appears well healed and approximated. No s/s of infection present. No neuro deficits noted. Pt A&Ox4, resp e/u, and skin warm and dry.

## 2014-06-22 ENCOUNTER — Encounter (HOSPITAL_COMMUNITY): Payer: Self-pay | Admitting: Neurosurgery

## 2014-06-23 LAB — URINE CULTURE
Colony Count: NO GROWTH
Culture: NO GROWTH

## 2014-07-06 NOTE — ED Provider Notes (Signed)
Medical screening examination/treatment/procedure(s) were performed by non-physician practitioner and as supervising physician I was immediately available for consultation/collaboration.   Nelia Shiobert L Marjie Chea, MD 07/06/14 1500

## 2015-03-18 IMAGING — CT CT CHEST W/O CM
2 of 3 series · 14 of 36 positions shown, 17 images · non-contrast
Comparison: Chest radiograph today

CLINICAL DATA: Status post cervical disc fusion on [REDACTED], with
increased difficulty walking and generalized weakness, chills

EXAM:
CT CHEST WITHOUT CONTRAST
TECHNIQUE: Multidetector CT imaging of the chest was performed following the
standard protocol without IV contrast..

[Series 201: chest without, idose (2) · axial · non-contrast · 0.73mm/px · z∈[+131,+391]mm · 11 of 62 slices shown, 14 images]
[im 5/62  mediastinal]
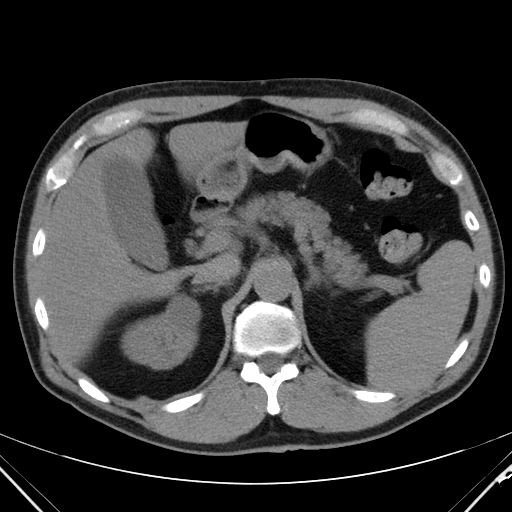
[im 5/62  lung]
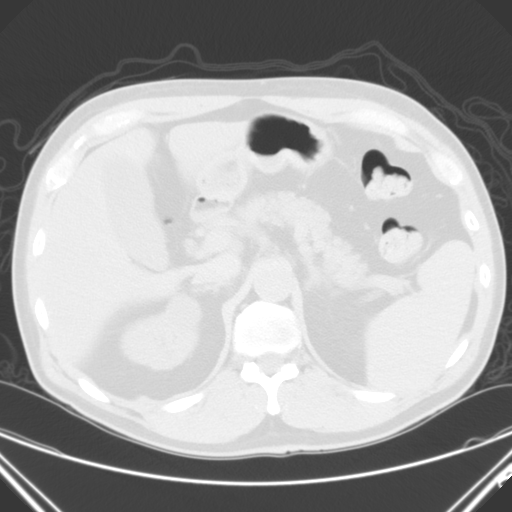
[im 10/62  lung]
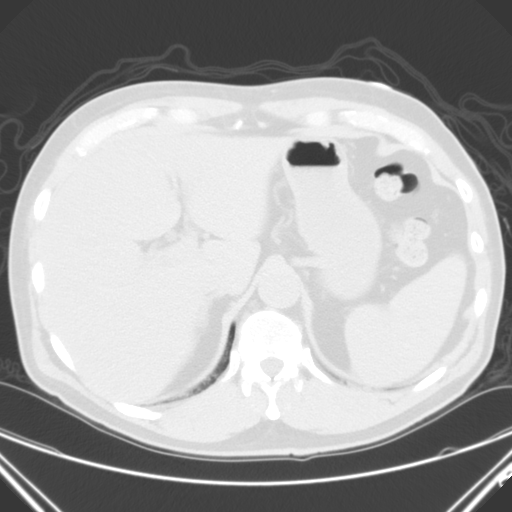
[im 14/62  lung]
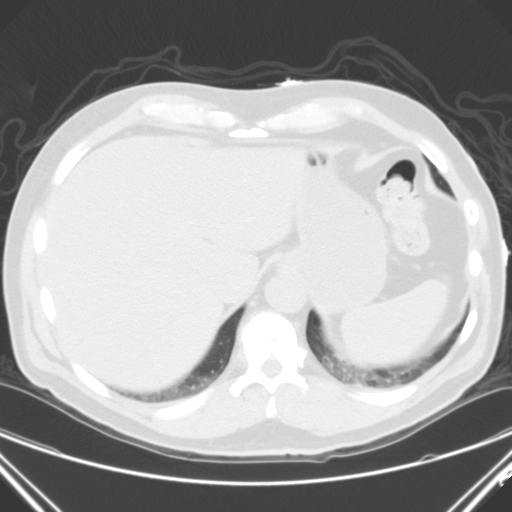
[im 21/62  lung]
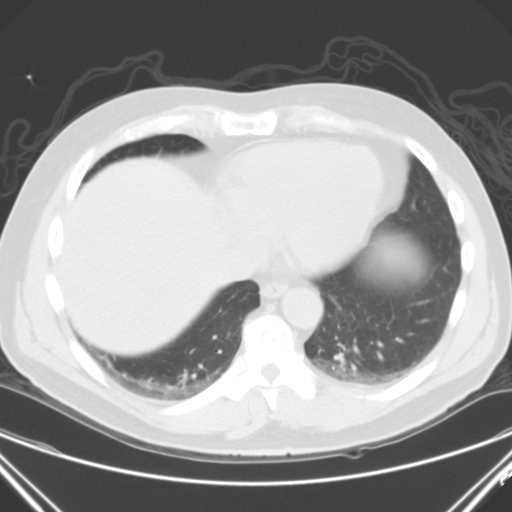
[im 25/62  mediastinal]
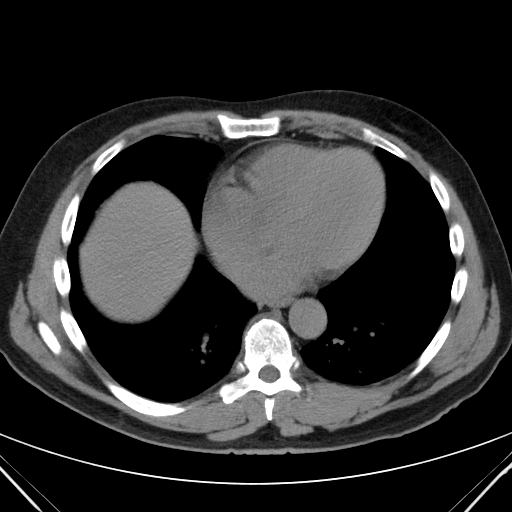
[im 25/62  lung]
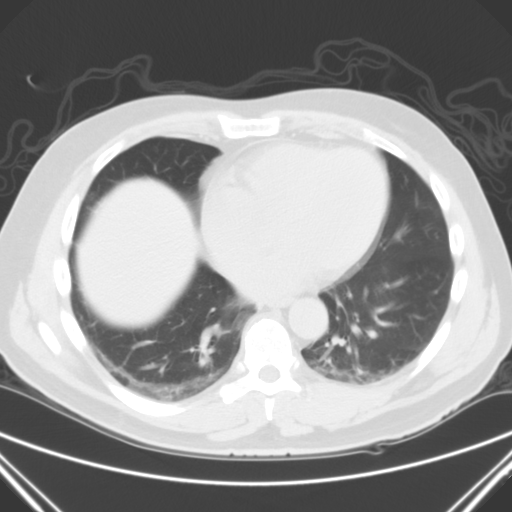
[im 32/62  lung]
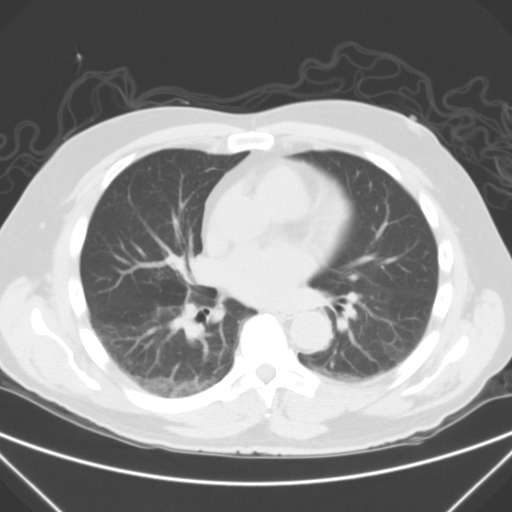
[im 37/62  lung]
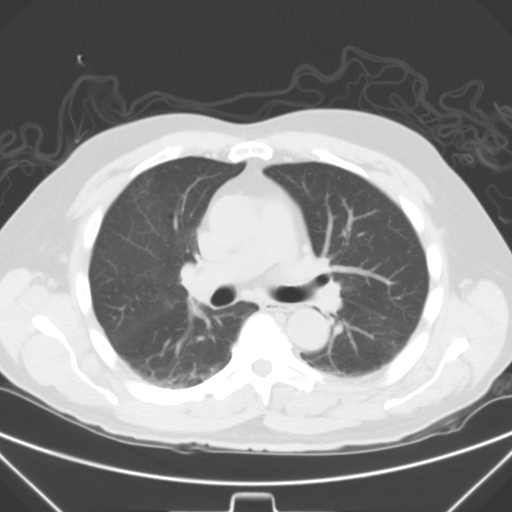
[im 41/62  lung]
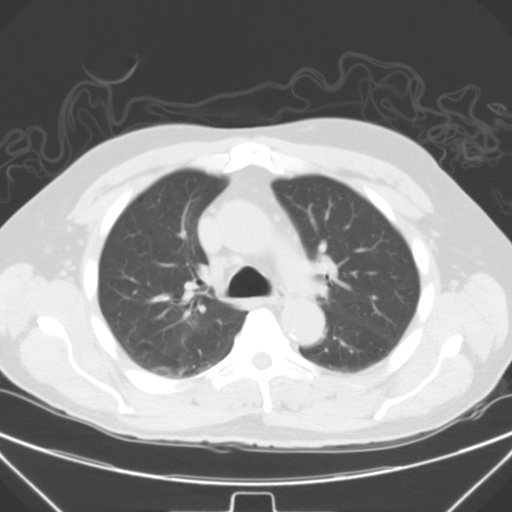
[im 48/62  mediastinal]
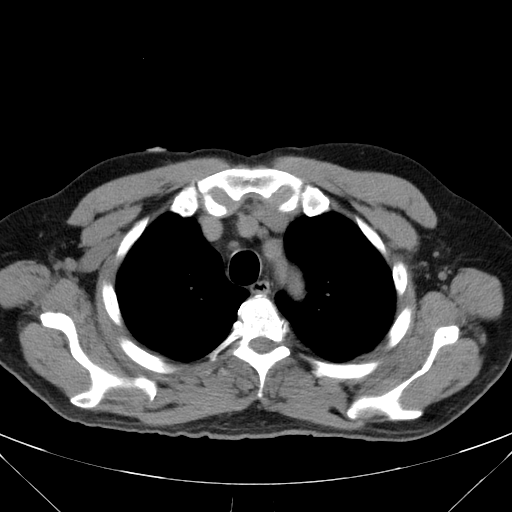
[im 48/62  lung]
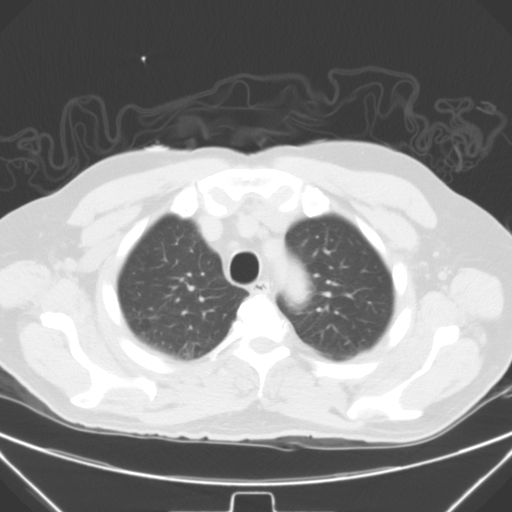
[im 52/62  lung]
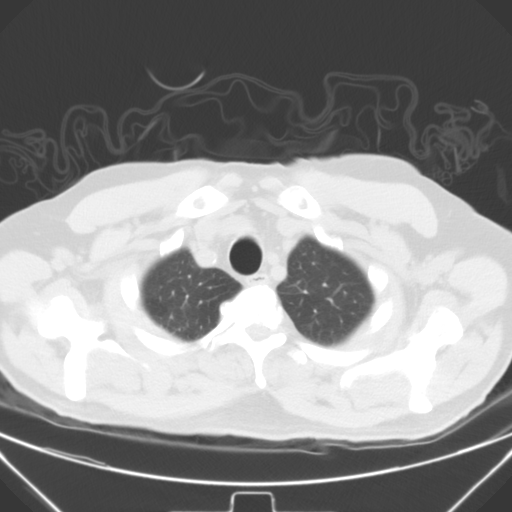
[im 57/62  lung]
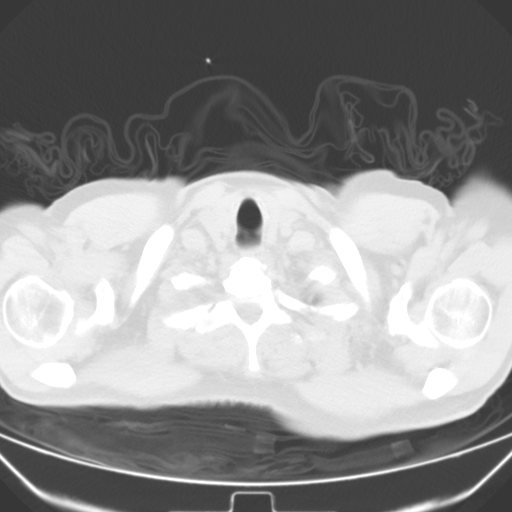

[Series 203: coronal, idose (2) · coronal · 0.50mm/px · 3 of 123 slices shown]
[im 25/123  lung]
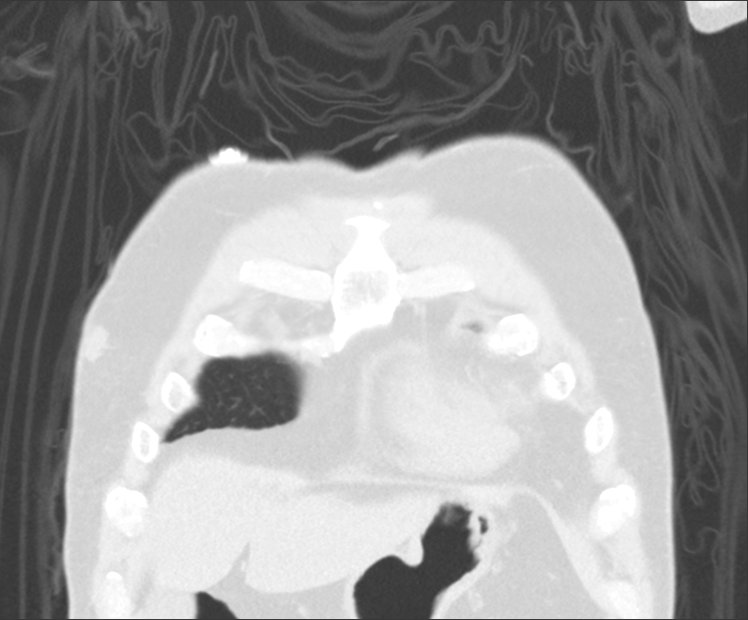
[im 49/123  lung]
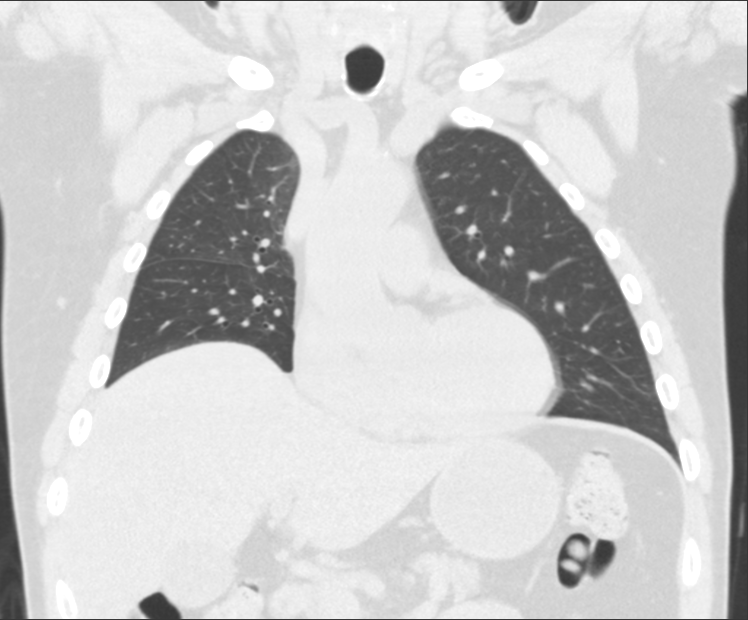
[im 74/123  lung]
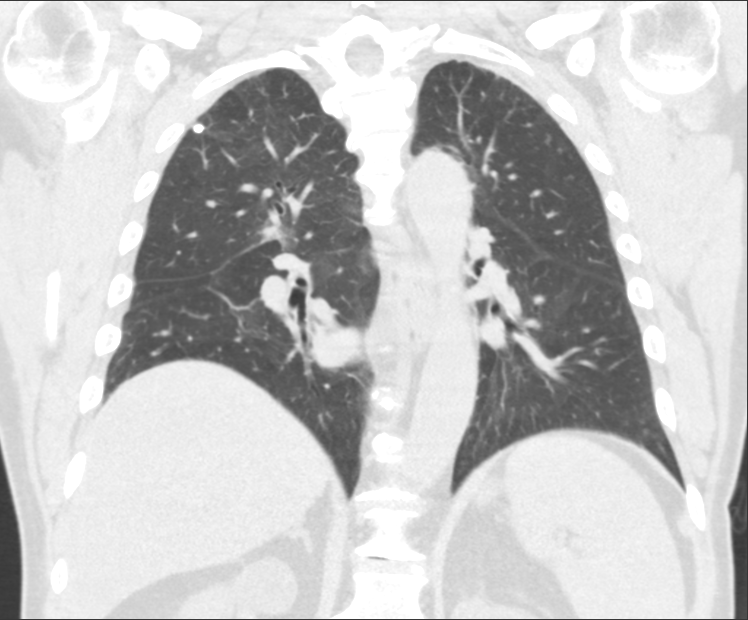

[14 of 36 positions shown; findings below may reference images not displayed]

FINDINGS: The left lung is clear except for mild dependent atelectasis. On the
right, there is a 6 mm calcified granuloma laterally in the apex.
There is mild dependent atelectasis on the right as well. There are
no other pulmonary parenchymal abnormalities. The nodular density
seen on chest radiograph appears to represent normal perihilar
vasculature.

Thoracic inlet is normal. Small nonpathologic mediastinal lymph
nodes. Minimal aortic calcification. Minimal coronary arterial
calcification.

No pleural or pericardial effusion.

Scans the upper abdomen only partially visualized the kidneys but
appear to demonstrate several partially visualized renal cysts.

There are no acute musculoskeletal findings.
IMPRESSION: No significant pulmonary parenchymal opacity or mass.

## 2017-02-18 ENCOUNTER — Ambulatory Visit (HOSPITAL_COMMUNITY)
Admission: EM | Admit: 2017-02-18 | Discharge: 2017-02-18 | Disposition: A | Discharge status: Home / Self Care | Payer: Medicare Other | Attending: Family Medicine | Admitting: Family Medicine

## 2017-02-18 ENCOUNTER — Encounter (HOSPITAL_COMMUNITY): Payer: Self-pay | Admitting: Emergency Medicine

## 2017-02-18 DIAGNOSIS — H6983 Other specified disorders of Eustachian tube, bilateral: Secondary | ICD-10-CM

## 2017-02-18 DIAGNOSIS — R0982 Postnasal drip: Secondary | ICD-10-CM | POA: Diagnosis not present

## 2017-02-18 DIAGNOSIS — T700XXA Otitic barotrauma, initial encounter: Secondary | ICD-10-CM | POA: Diagnosis not present

## 2017-02-18 DIAGNOSIS — J301 Allergic rhinitis due to pollen: Secondary | ICD-10-CM

## 2017-02-18 MED ORDER — PREDNISONE 10 MG PO TABS: ORAL_TABLET | ORAL | 0 refills | Status: AC

## 2017-02-18 NOTE — ED Provider Notes (Signed)
CSN: [id number]     Arrival date & time 02/18/17  1204 History   First MD Initiated Contact with Patient 02/18/17 1305     Chief Complaint  Patient presents with  . Sore Throat   (Consider location/radiation/quality/duration/timing/severity/associated sxs/prior Treatment) Pleasant but loquacious 71 year old active male presents with sore throat pain for 4 days. No fever. He does have a little achiness but he also has chronic sciatica type pain. He is describing PND that became worse when he was lying supine in the dentist chair to 3 days ago. His having a change in voice. He is complaining of pain along the eustachian tube Route behind the angle of the jaw also bilaterally.      Past Medical History:  Diagnosis Date  . Arthritis   . Cataract   . CKD (chronic kidney disease)    followed by Dr. Briant CedarMattingly with renal  . Gout   . Gout   . Heart murmur    with unremarkable echo 2012  . Hyperlipidemia   . Hypertension   . Hypothyroidism   . Thyroid disease    Past Surgical History:  Procedure Laterality Date  . ANTERIOR CERVICAL DECOMP/DISCECTOMY FUSION N/A 06/10/2014   Procedure: ANTERIOR CERVICAL DECOMPRESSION/DISCECTOMY FUSION 1 LEVEL Cervical four/five anterior cervical decompression with fusion interbody prosthesis plating and bonegraft;  Surgeon: Cristi LoronJeffrey D Jenkins, MD;  Location: [facility];  Service: Neurosurgery;  Laterality: N/A;  . CERVICAL FUSION    . CYSTOSCOPY  3/85   IVP - Microhematuria, prost inflammation, (-)  . Hospital  02/11/00   Syncope  . Left cataract with IOL Bilateral 10/06  . Right renal artery US (-),  Abd US(-)  05/03/98   Left renal stenosis  . Stress cardiolite  3/01   Normal  . US RENAL/AORTA  4/97   Left cyst   Family History  Problem Relation Age of Onset  . Diabetes Mother   . Kidney disease Mother     Renal disease, dialysis  . Kidney disease Sister     renal disease, dialysis  . Prostate cancer Paternal Uncle   . Colon cancer Neg Hx     Social History  Substance Use Topics  . Smoking status: Never Smoker  . Smokeless tobacco: Never Used  . Alcohol use No    Review of Systems  Constitutional: Positive for activity change. Negative for diaphoresis, fatigue and fever.  HENT: Positive for postnasal drip and sore throat. Negative for ear pain, facial swelling, rhinorrhea and trouble swallowing.   Eyes: Negative for pain, discharge and redness.  Respiratory: Negative for cough, chest tightness and shortness of breath.   Cardiovascular: Negative.   Gastrointestinal: Negative.   Musculoskeletal: Positive for back pain. Negative for neck pain and neck stiffness.  Neurological: Negative.     Allergies  Allopurinol; Allopurinol; Cymbalta [duloxetine hcl]; Cymbalta [duloxetine hcl]; and Hydrochlorothiazide  Home Medications   Prior to Admission medications   Medication Sig Start Date End Date Taking? Authorizing Provider  colchicine 0.6 MG tablet Take 0.6 mg by mouth daily.   Yes Historical Provider, MD  docusate sodium (COLACE) 100 MG capsule Take 100 mg by mouth 2 (two) times daily.   Yes Historical Provider, MD  gabapentin (NEURONTIN) 300 MG capsule Take 300 mg by mouth 2 (two) times daily.   Yes Historical Provider, MD  irbesartan (AVAPRO) 300 MG tablet Take 300 mg by mouth daily.   Yes Historical Provider, MD  levothyroxine (SYNTHROID, LEVOTHROID) 175 MCG tablet Take 175 mcg by  mouth daily before breakfast.   Yes Historical Provider, MD  predniSONE (DELTASONE) 10 MG tablet 1 tabs po once daily x 7 days. Take with food 02/18/17   Hayden Rasmussen, NP   Meds Ordered and Administered this Visit  Medications - No data to display  BP (!) 157/89 (BP Location: Right Arm)   Pulse 79   Temp 99.5 F (37.5 C) (Oral)   Resp 18   SpO2 97%  No data found.   Physical Exam  Constitutional: He is oriented to person, place, and time. He appears well-developed and well-nourished. No distress.  HENT:  Head: Normocephalic and  atraumatic.  Mouth/Throat: No oropharyngeal exudate.  Bilateral TMs mildly retracted otherwise normal. Oropharynx with patchy erythema, posterior pharynx mildly injected and clear PND. Swelling or exudate.  Neck: Normal range of motion. Neck supple.  Cardiovascular: Normal rate and regular rhythm.   Murmur heard.  holosystolic murmur  Pulmonary/Chest: Effort normal and breath sounds normal. No respiratory distress. He has no wheezes. He has no rales.  Musculoskeletal: Normal range of motion. He exhibits no edema.  Lymphadenopathy:    He has no cervical adenopathy.  Neurological: He is alert and oriented to person, place, and time.  Skin: Skin is warm and dry. No rash noted.  Psychiatric: He has a normal mood and affect.  Nursing note and vitals reviewed.   Urgent Care Course     Procedures (including critical care time)  Labs Review Labs Reviewed - No data to display  Imaging Review No results found.   Visual Acuity Review  Right Eye Distance:   Left Eye Distance:   Bilateral Distance:    Right Eye Near:   Left Eye Near:    Bilateral Near:         MDM   1. Acute seasonal allergic rhinitis due to pollen   2. PND (post-nasal drip)   3. ETD (Eustachian tube dysfunction), bilateral   4. Barotitis media, initial encounter    Reason for your sore throat, tenderness behind the jaw below the year and change in voice is due to drainage in the back of your throat. This appears to be due to environmental allergies. His long as you are exposed to the allergens he will have the symptoms. Recommend that she take either Allegra, Claritin or Zyrtec to help with the drainage and allergy symptoms. He may also use Rhinocort or Flonase nasal spray daily. Am also providing a low dose of prednisone to take just for a few days. Drink plenty of cool liquids to keep the drainage off her throat. You may also use Cepacol lozenges for sore throat pain. Meds ordered this encounter  Medications   . predniSONE (DELTASONE) 10 MG tablet    Sig: 1 tabs po once daily x 7 days. Take with food    Dispense:  7 tablet    Refill:  0    Order Specific Question:   Supervising Provider    Answer:   Elvina Sidle [[id number]]      Hayden Rasmussen, NP 02/18/17 1328

## 2017-02-18 NOTE — ED Triage Notes (Signed)
The patient presented to the [facility] with a complaint of a sore throat x 4 days. The patient denied any known fever at home. 

## 2017-02-18 NOTE — Discharge Instructions (Signed)
Reason for your sore throat, tenderness behind the jaw below the year and change in voice is due to drainage in the back of your throat. This appears to be due to environmental allergies. His long as you are exposed to the allergens he will have the symptoms. Recommend that she take either Allegra, Claritin or Zyrtec to help with the drainage and allergy symptoms. He may also use Rhinocort or Flonase nasal spray daily. Am also providing a low dose of prednisone to take just for a few days. Drink plenty of cool liquids to keep the drainage off her throat. You may also use Cepacol lozenges for sore throat pain.

## 2018-06-05 ENCOUNTER — Other Ambulatory Visit: Payer: Self-pay | Admitting: Internal Medicine

## 2018-06-05 ENCOUNTER — Ambulatory Visit
Admission: RE | Admit: 2018-06-05 | Discharge: 2018-06-05 | Disposition: A | Discharge status: Home / Self Care | Payer: Medicare Other | Source: Ambulatory Visit | Attending: Internal Medicine | Admitting: Internal Medicine

## 2018-06-05 DIAGNOSIS — M7989 Other specified soft tissue disorders: Secondary | ICD-10-CM

## 2018-06-05 DIAGNOSIS — M79672 Pain in left foot: Secondary | ICD-10-CM | POA: Insufficient documentation

## 2018-10-28 ENCOUNTER — Other Ambulatory Visit: Payer: Self-pay | Admitting: Sports Medicine

## 2018-10-28 DIAGNOSIS — M7061 Trochanteric bursitis, right hip: Secondary | ICD-10-CM

## 2018-10-28 DIAGNOSIS — M1611 Unilateral primary osteoarthritis, right hip: Secondary | ICD-10-CM

## 2018-10-28 DIAGNOSIS — M25551 Pain in right hip: Secondary | ICD-10-CM

## 2018-11-14 ENCOUNTER — Ambulatory Visit
Admission: RE | Admit: 2018-11-14 | Discharge: 2018-11-14 | Disposition: A | Discharge status: Home / Self Care | Payer: Medicare Other | Source: Ambulatory Visit | Attending: Sports Medicine | Admitting: Sports Medicine

## 2018-11-14 DIAGNOSIS — M7061 Trochanteric bursitis, right hip: Secondary | ICD-10-CM | POA: Diagnosis not present

## 2018-11-14 DIAGNOSIS — M1611 Unilateral primary osteoarthritis, right hip: Secondary | ICD-10-CM | POA: Diagnosis not present

## 2018-11-14 DIAGNOSIS — M25551 Pain in right hip: Secondary | ICD-10-CM | POA: Diagnosis present

## 2024-01-07 ENCOUNTER — Ambulatory Visit
Admission: EM | Admit: 2024-01-07 | Discharge: 2024-01-07 | Disposition: A | Discharge status: Home / Self Care | Payer: Medicare Other

## 2024-01-07 DIAGNOSIS — J069 Acute upper respiratory infection, unspecified: Secondary | ICD-10-CM | POA: Diagnosis not present

## 2024-01-07 MED ORDER — AMOXICILLIN-POT CLAVULANATE 875-125 MG PO TABS: 1.0000 | ORAL_TABLET | Freq: Two times a day (BID) | ORAL | 0 refills | Status: DC

## 2024-01-07 NOTE — ED Triage Notes (Signed)
 Patient states that this is the 3rd place he's been too. Patient states that he gets this twice a year.  Patient was swabbed at primary Wednesday morning. Strep came back negative.  Scratchy throat.

## 2024-01-07 NOTE — Discharge Instructions (Signed)
 Begin Augmentin  every morning and every evening for 7 bacteria which may be adding to your symptoms    You can take Tylenol  and/or Ibuprofen as needed for fever reduction and pain relief.   For cough: honey 1/2 to 1 teaspoon (you can dilute the honey in water or another fluid).  You can also use guaifenesin and dextromethorphan for cough. You can use a humidifier for chest congestion and cough.  If you don't have a humidifier, you can sit in the bathroom with the hot shower running.      For sore throat: try warm salt water gargles, cepacol lozenges, throat spray, warm tea or water with lemon/honey, popsicles or ice, or OTC cold relief medicine for throat discomfort.   It is important to stay hydrated: drink plenty of fluids (water, gatorade/powerade/pedialyte, juices, or teas) to keep your throat moisturized and help further relieve irritation/discomfort.

## 2024-01-07 NOTE — ED Provider Notes (Signed)
 Juan Jennings    CSN: [id number] Arrival date & time: 01/07/24  1003      History   Chief Complaint Chief Complaint  Patient presents with   Sore Throat    HPI Juan Jennings is a 78 y.o. male.   Patient presents for evaluation of sore throat and a nonproductive cough for 10 days.  Poor appetite just in [place] but able to no known sick contacts prior.  Endorses being seen twice for same symptoms but not given any medication.  Strep test negative PCP negative.  Past Medical History:  Diagnosis Date   Arthritis    Cataract    CKD (chronic kidney disease)    followed by Dr. 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/10/2014   Back pain 06/22/2013   Paresthesia 06/22/2013   Discoloration of skin of finger 10/17/2012   Plantar fasciitis 10/17/2012   Routine general medical examination at a health care facility 12/11/2011   FH: prostate cancer 12/11/2011   Colon cancer screening 12/11/2011   Murmur, cardiac 04/21/2011   DERMATITIS, ALLERGIC 09/01/2010   CRAMP OF LIMB 09/01/2010   KNEE PAIN, RIGHT 01/19/2010   TRANSAMINASES, SERUM, ELEVATED 09/21/2009   PERSONAL HISTORY ENDOCRN METABOLIC&IMMUNITY D/O 07/08/2009   FOOT PAIN 05/07/2009   DEGENERATIVE JOINT DISEASE, KNEE 09/04/2008   HYPERLIPIDEMIA 11/25/2007   RENAL ARTERY STENOSIS 11/25/2007   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   HEMATURIA, MICROSCOPIC, HX OF 11/25/2007   HYPOTHYROIDISM 11/16/2007   HYPERTENSION 11/16/2007   Chronic kidney disease, stage III (moderate) (HCC) 08/28/2007   GOUT 07/25/2007   EFFUSION, JOINT, OTHER [facility] SITE 07/25/2007    Past Surgical History:  Procedure Laterality Date   ANTERIOR CERVICAL DECOMP/DISCECTOMY FUSION N/A 06/10/2014   Procedure: ANTERIOR CERVICAL  DECOMPRESSION/DISCECTOMY FUSION 1 LEVEL Cervical four/five anterior cervical decompression with fusion interbody prosthesis plating and bonegraft;  Surgeon: Elder Greening, MD;  Location: [facility];  Service: Neurosurgery;  Laterality: N/A;   CERVICAL FUSION     CYSTOSCOPY  3/85   IVP - Microhematuria, prost inflammation, (-)   Hospital  02/11/00   Syncope   Left cataract with IOL Bilateral 10/06   Right renal artery US  (-),  Abd US (-)  05/03/98   Left renal stenosis   Stress cardiolite  3/01   Normal   US  RENAL/AORTA  4/97   Left cyst       Home Medications    Prior to Admission medications   Medication Sig Start Date End Date Taking? Authorizing Provider  colchicine  0.6 MG tablet Take 0.6 mg by mouth daily.   Yes [provider]  docusate sodium  (COLACE) 100 MG capsule Take 100 mg by mouth 2 (two) times daily.   Yes [provider]  gabapentin  (NEURONTIN ) 300 MG capsule Take 300 mg by mouth 2 (two) times daily.   Yes [provider]  irbesartan  (AVAPRO ) 300 MG tablet Take 300 mg by mouth daily.   Yes [provider]  levothyroxine  (SYNTHROID , LEVOTHROID) 175 MCG tablet Take 175 mcg by mouth daily before breakfast.   Yes [provider]  losartan (COZAAR) 50 MG tablet Take 1 tablet by mouth daily. 01/23/23  Yes [provider]  predniSONE  (  DELTASONE ) 10 MG tablet 1 tabs po once daily x 7 days. Take with food 02/18/17  Yes Mabe, Myrtie Atkinson, NP  rosuvastatin (CRESTOR) 10 MG tablet Take 1 tablet by mouth daily. 09/14/20  Yes [provider]  amoxicillin -clavulanate (AUGMENTIN ) 875-125 MG tablet Take 1 tablet by mouth every 12 (twelve) hours. 01/07/24   Reena Canning, NP    Family History Family History  Problem Relation Age of Onset   Diabetes Mother    Kidney disease Mother        Renal disease, dialysis   Kidney disease Sister        renal disease, dialysis   Prostate cancer Paternal Uncle    Colon cancer Neg Hx      Social History Social History   Tobacco Use   Smoking status: Never   Smokeless tobacco: Never  Substance Use Topics   Alcohol use: No   Drug use: No     Allergies   Ciprofloxacin, Allopurinol, Allopurinol, Amlodipine , Apixaban, Calcitriol, Cymbalta [duloxetine hcl], Cymbalta [duloxetine hcl], Doxycycline , Duloxetine, Hydrochlorothiazide, Indomethacin, and Thyroid   Review of Systems Review of Systems   Physical Exam Triage Vital Signs ED Triage Vitals  Encounter Vitals Group     BP 01/07/24 1043 (!) 155/105     Systolic BP Percentile --      Diastolic BP Percentile --      Pulse Rate 01/07/24 1043 95     Resp 01/07/24 1043 17     Temp 01/07/24 1043 98.4 F (36.9 C)     Temp Source 01/07/24 1043 Oral     SpO2 01/07/24 1043 95 %     Weight --      Height --      Head Circumference --      Peak Flow --      Pain Score 01/07/24 1042 0     Pain Loc --      Pain Education --      Exclude from Growth Chart --    No data found.  Updated Vital Signs BP (!) 155/105 (BP Location: Left Arm)   Pulse 95   Temp 98.4 F (36.9 C) (Oral)   Resp 17   SpO2 95%   Visual Acuity Right Eye Distance:   Left Eye Distance:   Bilateral Distance:    Right Eye Near:   Left Eye Near:    Bilateral Near:     Physical Exam Constitutional:      Appearance: Normal appearance. He is well-developed.  HENT:     Right Ear: Tympanic membrane, ear canal and external ear normal.     Left Ear: Tympanic membrane, ear canal and external ear normal.     Nose: Nose normal.     Mouth/Throat:     Pharynx: Posterior oropharyngeal erythema present.     Tonsils: No tonsillar exudate. 0 on the right. 0 on the left.  Cardiovascular:     Rate and Rhythm: Normal rate and regular rhythm.     Pulses: Normal pulses.     Heart sounds: Normal heart sounds.  Pulmonary:     Effort: Pulmonary effort is normal.     Breath sounds: Normal breath sounds.  Neurological:     Mental Status: He is alert  and oriented to person, place, and time. Mental status is at baseline.      UC Treatments / Results  Labs (all labs ordered are listed, but only abnormal results are displayed) Labs Reviewed - No data to display  EKG   Radiology No results found.  Procedures Procedures (including critical care time)  Medications Ordered in UC Medications - No data to display  Initial Impression / Assessment and Plan / UC Course  I have reviewed the triage vital signs and the nursing notes.  Pertinent labs & imaging results that were available during my care of the patient were reviewed by me and considered in my medical decision making (see chart for details).  Acute URI  Patient is in no signs of distress nor toxic appearing.  Vital signs are stable.  Low suspicion for pneumonia, pneumothorax or bronchitis and therefore will defer imaging.  Symptoms present for 10 days will provide bacterial coverage, prescribed Augmentin  . Declined lidocaine . May use additional over-the-counter medications as needed for supportive care.  May follow-up with urgent care as needed if symptoms persist or worsen.   Final Clinical Impressions(s) / UC Diagnoses   Final diagnoses:  Acute URI     Discharge Instructions      Begin Augmentin  every morning and every evening for 7 bacteria which may be adding to your symptoms    You can take Tylenol  and/or Ibuprofen as needed for fever reduction and pain relief.   For cough: honey 1/2 to 1 teaspoon (you can dilute the honey in water or another fluid).  You can also use guaifenesin and dextromethorphan for cough. You can use a humidifier for chest congestion and cough.  If you don't have a humidifier, you can sit in the bathroom with the hot shower running.      For sore throat: try warm salt water gargles, cepacol lozenges, throat spray, warm tea or water with lemon/honey, popsicles or ice, or OTC cold relief medicine for throat discomfort.   It is important to  stay hydrated: drink plenty of fluids (water, gatorade/powerade/pedialyte, juices, or teas) to keep your throat moisturized and help further relieve irritation/discomfort.    ED Prescriptions     Medication Sig Dispense Auth. Provider   amoxicillin -clavulanate (AUGMENTIN ) 875-125 MG tablet  (Status: Discontinued) Take 1 tablet by mouth every 12 (twelve) hours. 14 tablet Jatoria Kneeland R, NP   amoxicillin -clavulanate (AUGMENTIN ) 875-125 MG tablet Take 1 tablet by mouth every 12 (twelve) hours. 14 tablet Evelia Waskey R, NP      PDMP not reviewed this encounter.   Reena Canning, NP 01/07/24 1100

## 2024-10-07 ENCOUNTER — Ambulatory Visit: Admission: EM | Admit: 2024-10-07 | Discharge: 2024-10-07 | Disposition: A | Discharge status: Home / Self Care

## 2024-10-07 DIAGNOSIS — J029 Acute pharyngitis, unspecified: Secondary | ICD-10-CM

## 2024-10-07 DIAGNOSIS — Z8709 Personal history of other diseases of the respiratory system: Secondary | ICD-10-CM | POA: Diagnosis not present

## 2024-10-07 LAB — POCT RAPID STREP A (OFFICE): Rapid Strep A Screen: NEGATIVE

## 2024-10-07 MED ORDER — AMOXICILLIN 500 MG PO CAPS: 500.0000 mg | ORAL_CAPSULE | Freq: Two times a day (BID) | ORAL | 0 refills | Status: AC

## 2024-10-07 NOTE — Discharge Instructions (Addendum)
 Your strep test is negative.  However, based on the appearance of your throat today and your history of strep throat, I am treating you with a 10-day course of amoxicillin .  Please follow-up with your primary care provider for your recurrent symptoms.

## 2024-10-07 NOTE — ED Provider Notes (Signed)
 Juan Jennings    CSN: [id number] Arrival date & time: 10/07/24  1141      History   Chief Complaint Chief Complaint  Patient presents with   Sore Throat    HPI Juan Jennings is a 78 y.o. male.  Patient presents with 1 day history of sore throat.  No fever, cough, shortness of breath.  No OTC medications taken.  He reports recurrent sore throat when the weather changes twice a year.  Patient tested positive for strep throat on 12/08/2022 at [facility] urgent care.  The history is provided by the patient and medical records.    Past Medical History:  Diagnosis Date   Arthritis    Cataract    CKD (chronic kidney disease)    followed by Dr. 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   Back pain 06/22/2013   Paresthesia 06/22/2013   Discoloration of skin of finger 10/17/2012   Plantar fasciitis 10/17/2012   Routine general medical examination at a health care facility 12/11/2011   FH: prostate cancer 12/11/2011   Colon cancer screening 12/11/2011   Murmur, cardiac 04/21/2011   DERMATITIS, ALLERGIC 09/01/2010   CRAMP OF LIMB 09/01/2010   KNEE PAIN, RIGHT 01/19/2010   TRANSAMINASES, SERUM, ELEVATED 09/21/2009   PERSONAL HISTORY ENDOCRN METABOLIC&IMMUNITY D/O 07/08/2009   FOOT PAIN 05/07/2009   DEGENERATIVE JOINT DISEASE, KNEE 09/04/2008   HYPERLIPIDEMIA 11/25/2007   RENAL ARTERY STENOSIS 11/25/2007   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   HEMATURIA, MICROSCOPIC, HX OF 11/25/2007   HYPOTHYROIDISM 11/16/2007   HYPERTENSION 11/16/2007   Chronic kidney disease, stage III (moderate) (HCC) 08/28/2007   GOUT 07/25/2007   EFFUSION, JOINT, OTHER [facility] SITE 07/25/2007    Past Surgical History:  Procedure Laterality Date    ANTERIOR CERVICAL DECOMP/DISCECTOMY FUSION N/A 06/10/2014   Procedure: ANTERIOR CERVICAL DECOMPRESSION/DISCECTOMY FUSION 1 LEVEL Cervical four/five anterior cervical decompression with fusion interbody prosthesis plating and bonegraft;  Surgeon: Reyes JONETTA Budge, MD;  Location: [facility];  Service: Neurosurgery;  Laterality: N/A;   CERVICAL FUSION     CYSTOSCOPY  3/85   IVP - Microhematuria, prost inflammation, (-)   Hospital  02/11/00   Syncope   Left cataract with IOL Bilateral 10/06   Right renal artery US  (-),  Abd US (-)  05/03/98   Left renal stenosis   Stress cardiolite  3/01   Normal   US  RENAL/AORTA  4/97   Left cyst       Home Medications    Prior to Admission medications   Medication Sig Start Date End Date Taking? Authorizing Provider  amoxicillin  (AMOXIL ) 500 MG capsule Take 1 capsule (500 mg total) by mouth 2 (two) times daily for 10 days. 10/07/24 10/17/24 Yes Corlis Burnard DEL, NP  metoprolol succinate (TOPROL-XL) 25 MG 24 hr tablet Take 25 mg by mouth daily. 10/16/22  Yes [provider]  paricalcitol (ZEMPLAR) 1 MCG capsule Take by mouth. 06/12/24  Yes [provider]  colchicine  0.6 MG tablet Take 0.6 mg by mouth daily.    [provider]  docusate sodium  (COLACE) 100 MG capsule Take 100 mg by mouth 2 (two) times daily.    [provider]  gabapentin  (NEURONTIN ) 300 MG capsule Take 300 mg by mouth 2 (two) times daily.    [provider]  irbesartan  (AVAPRO ) 300 MG tablet Take 300 mg by mouth daily.    [provider]  levothyroxine  (SYNTHROID , LEVOTHROID) 175 MCG tablet Take 175 mcg by mouth daily before breakfast.    [provider]  losartan (COZAAR) 50 MG tablet Take 1 tablet by mouth daily. 01/23/23   [provider]  predniSONE  (DELTASONE ) 10 MG tablet 1 tabs po once daily x 7 days. Take with food Patient not taking: Reported on 10/07/2024 02/18/17   Tharon Lenis, NP  rosuvastatin (CRESTOR) 10 MG  tablet Take 1 tablet by mouth daily. 09/14/20   [provider]    Family History Family History  Problem Relation Age of Onset   Diabetes Mother    Kidney disease Mother        Renal disease, dialysis   Kidney disease Sister        renal disease, dialysis   Prostate cancer Paternal Uncle    Colon cancer Neg Hx     Social History Social History   Tobacco Use   Smoking status: Never   Smokeless tobacco: Never  Substance Use Topics   Alcohol use: No   Drug use: No     Allergies   Ciprofloxacin, Allopurinol, Allopurinol, Amlodipine , Apixaban, Calcitriol, Cymbalta [duloxetine hcl], Cymbalta [duloxetine hcl], Doxycycline , Duloxetine, Hydrochlorothiazide, Indomethacin, and Thyroid   Review of Systems Review of Systems  Constitutional:  Negative for chills and fever.  HENT:  Positive for sore throat. Negative for ear pain.   Respiratory:  Negative for cough and shortness of breath.      Physical Exam Triage Vital Signs ED Triage Vitals  Encounter Vitals Group     BP 10/07/24 1247 134/89     Girls Systolic BP Percentile --      Girls Diastolic BP Percentile --      Boys Systolic BP Percentile --      Boys Diastolic BP Percentile --      Pulse Rate 10/07/24 1247 61     Resp 10/07/24 1247 18     Temp 10/07/24 1247 97.6 F (36.4 C)     Temp src --      SpO2 10/07/24 1247 96 %     Weight --      Height --      Head Circumference --      Peak Flow --      Pain Score 10/07/24 1256 0     Pain Loc --      Pain Education --      Exclude from Growth Chart --    No data found.  Updated Vital Signs BP 134/89   Pulse 61   Temp 97.6 F (36.4 C)   Resp 18   SpO2 96%   Visual Acuity Right Eye Distance:   Left Eye Distance:   Bilateral Distance:    Right Eye Near:   Left Eye Near:    Bilateral Near:     Physical Exam Constitutional:      General: He is not in acute distress. HENT:     Right Ear: Tympanic membrane normal.     Left Ear: Tympanic  membrane normal.     Nose: Nose normal.     Mouth/Throat:     Mouth: Mucous membranes are moist.     Pharynx: Posterior oropharyngeal erythema present.     Comments: Throat is brightly erythematous.  Cardiovascular:     Rate and Rhythm: Normal rate and regular rhythm.     Heart sounds: Normal heart sounds.  Pulmonary:     Effort: Pulmonary effort is normal. No respiratory distress.     Breath sounds: Normal breath sounds.  Neurological:     Mental Status: He is alert.      UC Treatments / Results  Labs (all labs ordered are listed, but only abnormal results are displayed) Labs Reviewed  POCT RAPID STREP A (OFFICE) - Normal    EKG   Radiology No results found.  Procedures Procedures (including critical care time)  Medications Ordered in UC Medications - No data to display  Initial Impression / Assessment and Plan / UC Course  I have reviewed the triage vital signs and the nursing notes.  Pertinent labs & imaging results that were available during my care of the patient were reviewed by me and considered in my medical decision making (see chart for details).    Sore throat.  Afebrile and vital signs are stable.  Strep test done here today because patient tested positive for strep in 2024 at another urgent care.  Rapid strep negative.  Based on exam today and patient's history of strep, treating with amoxicillin .  Discussed symptomatic treatment including Tylenol  as needed.  Instructed patient to follow-up with his PCP at home in [place]  for his recurrent symptoms.  He agrees to plan of care.  Final Clinical Impressions(s) / UC Diagnoses   Final diagnoses:  Sore throat  Acute pharyngitis, unspecified etiology  History of strep sore throat     Discharge Instructions      Your strep test is negative.  However, based on the appearance of your throat today and your history of strep throat, I am treating you with a 10-day course of amoxicillin .  Please follow-up  with your primary care provider for your recurrent symptoms.       ED Prescriptions     Medication Sig Dispense Auth. Provider   amoxicillin  (AMOXIL ) 500 MG capsule Take 1 capsule (500 mg total) by mouth 2 (two) times daily for 10 days. 20 capsule Corlis Burnard DEL, NP      PDMP not reviewed this encounter.   Corlis Burnard DEL, NP 10/07/24 1336

## 2024-10-07 NOTE — ED Triage Notes (Addendum)
 Patient to Urgent Care with complaints of  a scratchy throat x1 day. States that this happens twice a year when the weather changes. No fevers.   No otc meds.

## 2024-12-23 ENCOUNTER — Ambulatory Visit
Admission: EM | Admit: 2024-12-23 | Discharge: 2024-12-23 | Disposition: A | Discharge status: Home / Self Care | Attending: Emergency Medicine | Admitting: Emergency Medicine

## 2024-12-23 ENCOUNTER — Encounter: Payer: Self-pay | Admitting: Emergency Medicine

## 2024-12-23 DIAGNOSIS — S61411A Laceration without foreign body of right hand, initial encounter: Secondary | ICD-10-CM

## 2024-12-23 DIAGNOSIS — S51811A Laceration without foreign body of right forearm, initial encounter: Secondary | ICD-10-CM

## 2024-12-23 NOTE — ED Triage Notes (Signed)
 Patient complains of skin tear to right  hand and lower forearm and laceration right pointer finger that happened yesterday. . Rates pain 5/10. Bleeding controlled at this time. Tetanus status unknown.

## 2024-12-23 NOTE — Discharge Instructions (Addendum)
 Today you were evaluated for the skin tear and laceration to your arm  4 Sutures were placed, please return in 10 to 14 days for removal  All wounds have been cleansed here in office  Cleanse daily with unscented soap and water, pat and do not rub, may cover with a nonstick Band-Aid if at risk for becoming dilated he otherwise may leave open to air  May apply topical antibiotic ointment per preference  Please monitor for signs of infection such as redness swelling or puslike drainage and if this occurs please return any point for reevaluation  May return at any point for any concerns regarding healing

## 2024-12-26 DIAGNOSIS — S61411A Laceration without foreign body of right hand, initial encounter: Secondary | ICD-10-CM | POA: Diagnosis not present

## 2025-01-01 ENCOUNTER — Encounter: Payer: Self-pay | Admitting: Emergency Medicine

## 2025-01-01 ENCOUNTER — Ambulatory Visit
Admission: EM | Admit: 2025-01-01 | Discharge: 2025-01-01 | Disposition: A | Discharge status: Home / Self Care | Source: Home / Self Care

## 2025-01-01 DIAGNOSIS — L089 Local infection of the skin and subcutaneous tissue, unspecified: Secondary | ICD-10-CM

## 2025-01-01 DIAGNOSIS — T148XXA Other injury of unspecified body region, initial encounter: Secondary | ICD-10-CM

## 2025-01-01 DIAGNOSIS — S61411D Laceration without foreign body of right hand, subsequent encounter: Secondary | ICD-10-CM | POA: Diagnosis not present

## 2025-01-01 MED ORDER — CEPHALEXIN 500 MG PO CAPS: 500.0000 mg | ORAL_CAPSULE | Freq: Three times a day (TID) | ORAL | 0 refills | Status: AC

## 2025-01-01 NOTE — ED Triage Notes (Signed)
 Patient here for wound check to right hand. Patient concerned about infection. Patient has taking anything for symptoms.

## 2025-01-01 NOTE — Discharge Instructions (Addendum)
 Today you are evaluated for your hand and based on the appearance as there is drainage in the area is red I do believe that it is infected and you will be started on antibiotics  Abrasions to the right arm have healed appropriately  Stitches to the hand have been removed in the clinic, the flap that it was holding together has close  Take cephalexin  every 8 hours for 5 days  Clean over the wound with unscented soap and water, pat and do not rub and cover with a nonstick Band-Aid until healed  May take Tylenol  as needed for any pain  If you continue to not see improvement in the wound please follow-up with urgent care or your primary doctor for reevaluation

## 2025-01-01 NOTE — ED Provider Notes (Signed)
 " CAY RALPH PELT    CSN: [id number] Arrival date & time: 01/01/25  1134      History   Chief Complaint Chief Complaint  Patient presents with   Wound Check    HPI Juan Jennings is a 79 y.o. male.   Patient presents for a laceration present to the right hand, requesting wound check as he has noticed purulent drainage last 2 to 3 days concerning for infection.  Past Medical History:  Diagnosis Date   Arthritis    Cataract    CKD (chronic kidney disease)    followed by Dr. 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   Back pain 06/22/2013   Paresthesia 06/22/2013   Discoloration of skin of finger 10/17/2012   Plantar fasciitis 10/17/2012   Routine general medical examination at a health care facility 12/11/2011   FH: prostate cancer 12/11/2011   Colon cancer screening 12/11/2011   Murmur, cardiac 04/21/2011   DERMATITIS, ALLERGIC 09/01/2010   CRAMP OF LIMB 09/01/2010   KNEE PAIN, RIGHT 01/19/2010   TRANSAMINASES, SERUM, ELEVATED 09/21/2009   PERSONAL HISTORY ENDOCRN METABOLIC&IMMUNITY D/O 07/08/2009   FOOT PAIN 05/07/2009   DEGENERATIVE JOINT DISEASE, KNEE 09/04/2008   HYPERLIPIDEMIA 11/25/2007   RENAL ARTERY STENOSIS 11/25/2007   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   HEMATURIA, MICROSCOPIC, HX OF 11/25/2007   HYPOTHYROIDISM 11/16/2007   HYPERTENSION 11/16/2007   Chronic kidney disease, stage III (moderate) (HCC) 08/28/2007   GOUT 07/25/2007   EFFUSION, JOINT, OTHER [facility] SITE 07/25/2007    Past Surgical History:  Procedure Laterality Date   ANTERIOR CERVICAL DECOMP/DISCECTOMY FUSION N/A 06/10/2014   Procedure: ANTERIOR CERVICAL DECOMPRESSION/DISCECTOMY FUSION 1 LEVEL Cervical four/five anterior cervical decompression with fusion interbody  prosthesis plating and bonegraft;  Surgeon: Reyes JONETTA Budge, MD;  Location: [facility];  Service: Neurosurgery;  Laterality: N/A;   CERVICAL FUSION     CYSTOSCOPY  3/85   IVP - Microhematuria, prost inflammation, (-)   Hospital  02/11/00   Syncope   Left cataract with IOL Bilateral 10/06   Right renal artery US  (-),  Abd US (-)  05/03/98   Left renal stenosis   Stress cardiolite  3/01   Normal   US  RENAL/AORTA  4/97   Left cyst       Home Medications    Prior to Admission medications  Medication Sig Start Date End Date Taking? Authorizing Provider  cephALEXin  (KEFLEX ) 500 MG capsule Take 1 capsule (500 mg total) by mouth 3 (three) times daily for 5 days. 01/01/25 01/06/25 Yes Alvira Hecht R, NP  colchicine  0.6 MG tablet Take 0.6 mg by mouth daily.    [provider]  docusate sodium  (COLACE) 100 MG capsule Take 100 mg by mouth 2 (two) times daily.    [provider]  gabapentin  (NEURONTIN ) 300 MG capsule Take 300 mg by mouth 2 (two) times daily.    [provider]  irbesartan  (AVAPRO ) 300 MG tablet Take 300 mg by mouth daily.    [provider]  levothyroxine  (SYNTHROID , LEVOTHROID) 175 MCG tablet Take 175 mcg by mouth daily before breakfast.    [provider]  losartan (COZAAR) 50 MG tablet Take 1 tablet by mouth daily.  01/23/23   [provider]  metoprolol succinate (TOPROL-XL) 25 MG 24 hr tablet Take 25 mg by mouth daily. 10/16/22   [provider]  paricalcitol (ZEMPLAR) 1 MCG capsule Take by mouth. 06/12/24   [provider]  predniSONE  (DELTASONE ) 10 MG tablet 1 tabs po once daily x 7 days. Take with food Patient not taking: Reported on 10/07/2024 02/18/17   Tharon Lenis, NP  rosuvastatin (CRESTOR) 10 MG tablet Take 1 tablet by mouth daily. 09/14/20   [provider]    Family History Family History  Problem Relation Age of Onset   Diabetes Mother    Kidney disease Mother        Renal  disease, dialysis   Kidney disease Sister        renal disease, dialysis   Prostate cancer Paternal Uncle    Colon cancer Neg Hx     Social History Social History[1]   Allergies   Ciprofloxacin, Allopurinol, Allopurinol, Amlodipine , Apixaban, Calcitriol, Cymbalta [duloxetine hcl], Cymbalta [duloxetine hcl], Doxycycline , Duloxetine, Hydrochlorothiazide, Indomethacin, and Thyroid   Review of Systems Review of Systems   Physical Exam Triage Vital Signs ED Triage Vitals  Encounter Vitals Group     BP 01/01/25 1154 129/88     Girls Systolic BP Percentile --      Girls Diastolic BP Percentile --      Boys Systolic BP Percentile --      Boys Diastolic BP Percentile --      Pulse Rate 01/01/25 1154 84     Resp 01/01/25 1154 20     Temp 01/01/25 1154 (!) 97.4 F (36.3 C)     Temp Source 01/01/25 1154 Oral     SpO2 01/01/25 1153 98 %     Weight --      Height --      Head Circumference --      Peak Flow --      Pain Score 01/01/25 1151 3     Pain Loc --      Pain Education --      Exclude from Growth Chart --    No data found.  Updated Vital Signs BP 129/88 (BP Location: Right Arm)   Pulse 84   Temp (!) 97.4 F (36.3 C) (Oral)   Resp 20   SpO2 98%   Visual Acuity Right Eye Distance:   Left Eye Distance:   Bilateral Distance:    Right Eye Near:   Left Eye Near:    Bilateral Near:     Physical Exam Constitutional:      Appearance: Normal appearance.  Eyes:     Extraocular Movements: Extraocular movements intact.  Pulmonary:     Effort: Pulmonary effort is normal.  Skin:    Comments: 2 x 2 cm laceration present to the right hand, 3 sutures in place, yellow granulation noted within the wound bed with scant purulent drainage with mild erythema surrounding  Neurological:     Mental Status: He is alert.      UC Treatments / Results  Labs (all labs ordered are listed, but only abnormal results are displayed) Labs Reviewed - No data to  display  EKG   Radiology No results found.  Procedures Procedures (including critical care time)  Medications Ordered in UC Medications - No data to display  Initial Impression / Assessment and Plan / UC Course  I have reviewed the triage vital signs and the nursing notes.  Pertinent labs & imaging results that  were available during my care of the patient were reviewed by me and considered in my medical decision making (see chart for details).  Laceration of right hand without foreign body, subsequent encounter, infected wound  Presentation of the wound concerning for infection, discussed with patient, prescribed cephalexin , 3 sutures placed by this provider to hold wound flap together, has adhered therefore sutures removed during visit, wound cleansed in office and recommended daily cleansing with unscented soap and water at home and covering with a nonstick bandage advised to monitor closely and follow-up for any further concern Final Clinical Impressions(s) / UC Diagnoses   Final diagnoses:  Laceration of right hand without foreign body, subsequent encounter  Infected wound     Discharge Instructions      Today you are evaluated for your hand and based on the appearance as there is drainage in the area is red I do believe that it is infected and you will be started on antibiotics  Abrasions to the right arm have healed appropriately  Stitches to the hand have been removed in the clinic, the flap that it was holding together has close  Take cephalexin  every 8 hours for 5 days  Clean over the wound with unscented soap and water, pat and do not rub and cover with a nonstick Band-Aid until healed  May take Tylenol  as needed for any pain  If you continue to not see improvement in the wound please follow-up with urgent care or your primary doctor for reevaluation   ED Prescriptions     Medication Sig Dispense Auth. Provider   cephALEXin  (KEFLEX ) 500 MG capsule Take 1  capsule (500 mg total) by mouth 3 (three) times daily for 5 days. 15 capsule Jermery Caratachea R, NP      PDMP not reviewed this encounter.    [1]  Social History Tobacco Use   Smoking status: Never   Smokeless tobacco: Never  Substance Use Topics   Alcohol use: No   Drug use: No     Teresa Shelba SAUNDERS, NP 01/01/25 1234  "
